# Patient Record
Sex: Female | Born: 1970
Health system: Southern US, Community
[De-identification: ages and names within clinical notes are randomized; demographics above are authoritative.]

## PROBLEM LIST (undated history)

## (undated) DIAGNOSIS — T7840XA Allergy, unspecified, initial encounter: Secondary | ICD-10-CM

## (undated) DIAGNOSIS — E079 Disorder of thyroid, unspecified: Secondary | ICD-10-CM

## (undated) HISTORY — DX: Allergy, unspecified, initial encounter: T78.40XA

## (undated) HISTORY — PX: APPENDECTOMY: SHX54

## (undated) HISTORY — DX: Disorder of thyroid, unspecified: E07.9

## (undated) HISTORY — PX: OTHER SURGICAL HISTORY: SHX169

---

## 2000-03-13 ENCOUNTER — Other Ambulatory Visit: Admission: RE | Admit: 2000-03-13 | Discharge: 2000-03-13 | Payer: Self-pay | Admitting: Gynecology

## 2001-03-19 ENCOUNTER — Other Ambulatory Visit: Admission: RE | Admit: 2001-03-19 | Discharge: 2001-03-19 | Payer: Self-pay | Admitting: Gynecology

## 2002-04-22 ENCOUNTER — Other Ambulatory Visit: Admission: RE | Admit: 2002-04-22 | Discharge: 2002-04-22 | Payer: Self-pay | Admitting: Gynecology

## 2003-05-19 ENCOUNTER — Other Ambulatory Visit: Admission: RE | Admit: 2003-05-19 | Discharge: 2003-05-19 | Payer: Self-pay | Admitting: Gynecology

## 2004-05-19 ENCOUNTER — Other Ambulatory Visit: Admission: RE | Admit: 2004-05-19 | Discharge: 2004-05-19 | Payer: Self-pay | Admitting: Gynecology

## 2005-01-09 ENCOUNTER — Other Ambulatory Visit: Admission: RE | Admit: 2005-01-09 | Discharge: 2005-01-09 | Payer: Self-pay | Admitting: Gynecology

## 2005-02-08 ENCOUNTER — Encounter: Admission: RE | Admit: 2005-02-08 | Discharge: 2005-02-15 | Payer: Self-pay | Admitting: Occupational Medicine

## 2005-07-13 ENCOUNTER — Ambulatory Visit (HOSPITAL_COMMUNITY): Admission: RE | Admit: 2005-07-13 | Discharge: 2005-07-13 | Payer: Self-pay | Admitting: Gynecology

## 2005-08-07 ENCOUNTER — Inpatient Hospital Stay (HOSPITAL_COMMUNITY): Admission: AD | Admit: 2005-08-07 | Discharge: 2005-08-10 | Payer: Self-pay | Admitting: Gynecology

## 2005-09-27 ENCOUNTER — Other Ambulatory Visit: Admission: RE | Admit: 2005-09-27 | Discharge: 2005-09-27 | Payer: Self-pay | Admitting: Gynecology

## 2006-04-17 ENCOUNTER — Encounter: Admission: RE | Admit: 2006-04-17 | Discharge: 2006-04-17 | Payer: Self-pay | Admitting: Gynecology

## 2007-03-14 ENCOUNTER — Other Ambulatory Visit: Admission: RE | Admit: 2007-03-14 | Discharge: 2007-03-14 | Payer: Self-pay | Admitting: Gynecology

## 2008-05-26 ENCOUNTER — Ambulatory Visit: Payer: Self-pay | Admitting: Gynecology

## 2008-05-26 ENCOUNTER — Other Ambulatory Visit: Admission: RE | Admit: 2008-05-26 | Discharge: 2008-05-26 | Payer: Self-pay | Admitting: Gynecology

## 2008-06-25 ENCOUNTER — Ambulatory Visit: Payer: Self-pay | Admitting: Gynecology

## 2008-06-30 ENCOUNTER — Ambulatory Visit: Payer: Self-pay | Admitting: Gynecology

## 2009-02-19 ENCOUNTER — Inpatient Hospital Stay (HOSPITAL_COMMUNITY): Admission: AD | Admit: 2009-02-19 | Discharge: 2009-02-21 | Payer: Self-pay | Admitting: Obstetrics and Gynecology

## 2009-08-10 ENCOUNTER — Emergency Department (HOSPITAL_COMMUNITY): Admission: EM | Admit: 2009-08-10 | Discharge: 2009-08-10 | Payer: Self-pay | Admitting: Family Medicine

## 2009-11-24 ENCOUNTER — Ambulatory Visit: Payer: Self-pay | Admitting: Gynecology

## 2010-03-04 ENCOUNTER — Ambulatory Visit: Payer: Self-pay | Admitting: Gynecology

## 2010-03-30 ENCOUNTER — Ambulatory Visit: Payer: Self-pay | Admitting: Gynecology

## 2010-04-04 ENCOUNTER — Ambulatory Visit: Payer: Self-pay | Admitting: Gynecology

## 2010-09-09 LAB — CBC
HCT: 39.4 % (ref 36.0–46.0)
Hemoglobin: 13.4 g/dL (ref 12.0–15.0)
MCHC: 34.1 g/dL (ref 30.0–36.0)
MCHC: 34.7 g/dL (ref 30.0–36.0)
MCV: 111.4 fL — ABNORMAL HIGH (ref 78.0–100.0)
Platelets: 134 10*3/uL — ABNORMAL LOW (ref 150–400)
Platelets: 137 10*3/uL — ABNORMAL LOW (ref 150–400)
RDW: 13 % (ref 11.5–15.5)
WBC: 11 10*3/uL — ABNORMAL HIGH (ref 4.0–10.5)

## 2010-10-21 NOTE — H&P (Signed)
NAMEMAYBEL, Victoria Ellison                ACCOUNT NO.:  [id number]   MEDICAL RECORD NO.:  [id number]          PATIENT TYPE:  INP   LOCATION:  9163                          FACILITY:  [facility]   PHYSICIAN:  Juan H. Lily Peer, M.D.DATE OF BIRTH:  Aug 16, 1970   DATE OF ADMISSION:  08/07/2005  DATE OF DISCHARGE:                                HISTORY & PHYSICAL   CHIEF COMPLAINT:  Spontaneous rupture of membranes and contractions.   HISTORY:  The patient is a 40 year old, gravida 3, para 2, who is currently  40-2/[redacted] weeks gestation, presented to [facility] complaining of  spontaneous rupture of membranes around 2130 hours on March 5.  On arrival  to [facility] she was 3 cm, 90% effaced and contracting three to four  minutes apart with a reassuring fetal heart rate tracing.  Vital signs were  stable and normotensive.  Upon arrival to labor and delivery she was  rechecked.  She was 4 cm, 90% effaced, -1 station.  Had a spontaneous  deceleration to the 60's for a short period and returned back to to  baseline.  The patient's prenatal course significant for the fact that she  had a normal first trimester screen and normal alpha-fetoprotein test.  She  is GBS negative.  The patient otherwise had an uneventful prenatal course  with the exception of a urinary tract infection and nonspecific palpitations  during the third trimester which she was found to have a physiological  murmur and a normal 12-lead EKG, CBC and thyroid.   ALLERGIES:  She is allergic to SULFA and AMOXICILLIN.   OB HISTORY:  She has had two normal spontaneous vaginal deliveries in 1999  and 2001 respectively at 38 and 41 weeks respectively.   PAST MEDICAL HISTORY:  1.  She had an appendectomy in 1980.  2.  She was diagnosed with hypothyroidism in 1985, currently on no      medications.   PHYSICAL EXAMINATION:  GENERAL:  Well-developed, well-nourished female.  HEENT:  Unremarkable __________ .  NECK:  No carotid bruits or  thyromegaly  LUNGS:  Clear to auscultation without rhonchus or wheezing.  HEART:  Regular rate and rhythm.  No murmurs or gallops.  BREASTS:  Not done.  ABDOMEN:  Gravid uterus.  __________  presentation on Leopold maneuver.  PELVIC:  4 cm, 90%, -1 station.  Gross rupture of membranes of clear.  EXTREMITIES:  DTR 1+. Negative clonus.  Trace edema.   PRENATAL LABS:  O positive blood type, negative antibody screen.  Rubella  with evidence of immunity.  Hepatitis, HIV and VDRL non-reactive.  The  patient with norma first trimester screen and normal alpha-fetoprotein.  No  genetic amniocentesis.  Group B strep was negative.   ASSESSMENT:  A 40 year old, gravida 3, para 2, with spontaneous rupture of  membranes, group B strep negative, 4 cm, 90% effaced, -1 station, reassuring  fetal heart rate tracing, spontaneous contractions every two to three  minutes apart.  The patient requesting epidural.  Will continue to monitor  closely. Anticipate vaginal delivery.   PLAN:  As per assessment above.  Juan H. Lily Peer, M.D.  Electronically Signed     JHF/MEDQ  D:  08/08/2005  T:  08/08/2005  Job:  [id number]

## 2011-04-06 ENCOUNTER — Encounter: Payer: Self-pay | Admitting: *Deleted

## 2011-04-10 ENCOUNTER — Ambulatory Visit (INDEPENDENT_AMBULATORY_CARE_PROVIDER_SITE_OTHER): Payer: 59 | Admitting: Gynecology

## 2011-04-10 ENCOUNTER — Encounter: Payer: Self-pay | Admitting: Gynecology

## 2011-04-10 ENCOUNTER — Other Ambulatory Visit (HOSPITAL_COMMUNITY)
Admission: RE | Admit: 2011-04-10 | Discharge: 2011-04-10 | Disposition: A | Discharge status: Home / Self Care | Payer: 59 | Source: Ambulatory Visit | Attending: Gynecology | Admitting: Gynecology

## 2011-04-10 VITALS — BP 102/62 | Ht 66.5 in | Wt 156.5 lb

## 2011-04-10 DIAGNOSIS — Z01419 Encounter for gynecological examination (general) (routine) without abnormal findings: Secondary | ICD-10-CM | POA: Insufficient documentation

## 2011-04-10 DIAGNOSIS — Z13220 Encounter for screening for lipoid disorders: Secondary | ICD-10-CM

## 2011-04-10 DIAGNOSIS — Z131 Encounter for screening for diabetes mellitus: Secondary | ICD-10-CM

## 2011-04-10 DIAGNOSIS — Z30431 Encounter for routine checking of intrauterine contraceptive device: Secondary | ICD-10-CM

## 2011-04-10 NOTE — Progress Notes (Signed)
Victoria Ellison 1971/06/03 [id number]        40 y.o.  for annual exam.  Doing well does note SUI type symptoms primarily with running or exercising. Has Mirena IUD and has not had a menses with this.  Past medical history,surgical history, medications, allergies, family history and social history were all reviewed and documented in the EPIC chart. ROS:  Was performed and pertinent positives and negatives are included in the history.  Exam: chaperone present Filed Vitals:   04/10/11 1551  BP: 102/62   General appearance  Normal Skin grossly normal Head/Neck normal with no cervical or supraclavicular adenopathy thyroid normal Lungs  clear Cardiac RR, without RMG Abdominal  soft, nontender, without masses, organomegaly or hernia Breasts  examined lying and sitting without masses, retractions, discharge or axillary adenopathy. Pelvic  Ext/BUS/vagina  normal   Cervix  normal  IUD string not visualized, Pap done  Uterus  axial, normal size, shape and contour, midline and mobile nontender   Adnexa  Without masses or tenderness    Anus and perineum  normal   Rectovaginal  normal sphincter tone without palpated masses or tenderness.    Assessment/Plan:  40 y.o. female for annual exam.    1. IUD management. Her IUD string was not visualized. Recommend ultrasound to document placement. She is amenorrheic with the IUD.  I suspect everything is fine but we'll at least check placement once for reassurance and she agrees with this. 2. SUI symptoms. Patient has classic SUI symptoms and I and going to refer to urology to discuss possibility of sling. Patient's unsure if she wants surgery but she can hear what is involved and she agrees with this. 3. Health maintenance. SBE monthly reviewed. Never had mammogram and I recommended baseline mammography. I did a Pap smear today as her last Pap smear documented in our chart was 3 years ago. Assuming this one is normal having no history of abnormals previously,  we'll plan an every 3 year surveillance.  Check baseline CBC glucose lipid profile urinalysis. She'll follow up for her ultrasound and otherwise assuming she continues well she'll follow up in a year sooner as needed.    Dara Lords MD, 4:21 PM 04/10/2011

## 2011-04-11 ENCOUNTER — Other Ambulatory Visit: Payer: Self-pay | Admitting: *Deleted

## 2011-04-11 ENCOUNTER — Telehealth: Payer: Self-pay | Admitting: *Deleted

## 2011-04-11 DIAGNOSIS — N393 Stress incontinence (female) (male): Secondary | ICD-10-CM

## 2011-04-11 NOTE — Telephone Encounter (Signed)
Lm for patient to call.  Appointment set with Dr. Sherron Monday on 05/22/11 @8 :15am.  To arrive at 8am and bring ID, insurance card, and list of meds.

## 2011-04-12 NOTE — Telephone Encounter (Signed)
Patient informed. 

## 2011-04-12 NOTE — Telephone Encounter (Signed)
Will send note to Dr. Mina Marble office after patient's ultrasound in the office.

## 2011-04-25 ENCOUNTER — Ambulatory Visit (INDEPENDENT_AMBULATORY_CARE_PROVIDER_SITE_OTHER): Payer: 59

## 2011-04-25 ENCOUNTER — Ambulatory Visit (INDEPENDENT_AMBULATORY_CARE_PROVIDER_SITE_OTHER): Payer: 59 | Admitting: Gynecology

## 2011-04-25 ENCOUNTER — Encounter: Payer: Self-pay | Admitting: Gynecology

## 2011-04-25 DIAGNOSIS — Z30431 Encounter for routine checking of intrauterine contraceptive device: Secondary | ICD-10-CM

## 2011-04-25 DIAGNOSIS — N8300 Follicular cyst of ovary, unspecified side: Secondary | ICD-10-CM

## 2011-04-25 DIAGNOSIS — N898 Other specified noninflammatory disorders of vagina: Secondary | ICD-10-CM

## 2011-04-25 DIAGNOSIS — T8339XA Other mechanical complication of intrauterine contraceptive device, initial encounter: Secondary | ICD-10-CM

## 2011-04-25 DIAGNOSIS — B3731 Acute candidiasis of vulva and vagina: Secondary | ICD-10-CM

## 2011-04-25 DIAGNOSIS — B373 Candidiasis of vulva and vagina: Secondary | ICD-10-CM

## 2011-04-25 MED ORDER — FLUCONAZOLE 200 MG PO TABS: 200.0000 mg | ORAL_TABLET | Freq: Every day | ORAL | Status: AC

## 2011-04-25 NOTE — Progress Notes (Signed)
Patient presents for ultrasound IUD check. Her Pap smear from her visit returned showing endometrial cells and yeast. Patient did treat herself with OTC antifungal is at the did not seem to totally clear her symptoms of itching.  Ultrasound shows endometrial echo 2.7 mm right and left ovaries are normal IUD is within the endometrial cavity.  Assessment and plan: 1. Yeast vaginitis. We'll treat with Diflucan 200 mg daily x3 days. 2. IUD placement. Ultrasound confirms intrauterine placement. 3. Endometrial cells on Pap smear. Given her clinical picture with IUD and send an endometrial echo of 2.7 mm I think the endometrial cells are of clinical insignificance.  Routine follow up with Pap smear next year recommended. Patient agrees with this.

## 2011-05-02 ENCOUNTER — Telehealth: Payer: Self-pay | Admitting: *Deleted

## 2011-05-02 NOTE — Telephone Encounter (Signed)
Message copied by Valeda Malm L on Tue May 02, 2011 11:19 AM ------      Message from: Colin Broach P      Created: Mon Apr 10, 2011  4:26 PM       Schedule an appointment with urology in reference to stress incontinence symptoms for consideration of sling

## 2011-05-02 NOTE — Telephone Encounter (Signed)
Patient was informed appointment set with Dr. Sherron Monday on 05/22/11 @ 8am.  Records faxed.

## 2011-06-20 ENCOUNTER — Ambulatory Visit: Payer: 59 | Attending: Urology | Admitting: Physical Therapy

## 2011-06-20 DIAGNOSIS — N393 Stress incontinence (female) (male): Secondary | ICD-10-CM | POA: Insufficient documentation

## 2011-06-20 DIAGNOSIS — R29898 Other symptoms and signs involving the musculoskeletal system: Secondary | ICD-10-CM | POA: Insufficient documentation

## 2011-06-20 DIAGNOSIS — IMO0001 Reserved for inherently not codable concepts without codable children: Secondary | ICD-10-CM | POA: Insufficient documentation

## 2012-02-28 ENCOUNTER — Other Ambulatory Visit: Payer: Self-pay | Admitting: Gynecology

## 2012-02-28 DIAGNOSIS — Z1231 Encounter for screening mammogram for malignant neoplasm of breast: Secondary | ICD-10-CM

## 2012-03-21 ENCOUNTER — Ambulatory Visit (HOSPITAL_COMMUNITY)
Admission: RE | Admit: 2012-03-21 | Discharge: 2012-03-21 | Disposition: A | Discharge status: Home / Self Care | Payer: 59 | Source: Ambulatory Visit | Attending: Gynecology | Admitting: Gynecology

## 2012-03-21 DIAGNOSIS — Z1231 Encounter for screening mammogram for malignant neoplasm of breast: Secondary | ICD-10-CM | POA: Insufficient documentation

## 2012-04-11 ENCOUNTER — Ambulatory Visit (INDEPENDENT_AMBULATORY_CARE_PROVIDER_SITE_OTHER): Payer: 59 | Admitting: Gynecology

## 2012-04-11 ENCOUNTER — Encounter: Payer: Self-pay | Admitting: Gynecology

## 2012-04-11 VITALS — BP 116/74 | Ht 66.5 in | Wt 158.0 lb

## 2012-04-11 DIAGNOSIS — Z01419 Encounter for gynecological examination (general) (routine) without abnormal findings: Secondary | ICD-10-CM

## 2012-04-11 DIAGNOSIS — B373 Candidiasis of vulva and vagina: Secondary | ICD-10-CM

## 2012-04-11 DIAGNOSIS — B3731 Acute candidiasis of vulva and vagina: Secondary | ICD-10-CM

## 2012-04-11 DIAGNOSIS — Z30431 Encounter for routine checking of intrauterine contraceptive device: Secondary | ICD-10-CM

## 2012-04-11 DIAGNOSIS — N898 Other specified noninflammatory disorders of vagina: Secondary | ICD-10-CM

## 2012-04-11 LAB — WET PREP FOR TRICH, YEAST, CLUE
Clue Cells Wet Prep HPF POC: NONE SEEN
Trich, Wet Prep: NONE SEEN

## 2012-04-11 MED ORDER — FLUCONAZOLE 150 MG PO TABS: 150.0000 mg | ORAL_TABLET | ORAL | Status: DC

## 2012-04-11 MED ORDER — FLUCONAZOLE 200 MG PO TABS: 200.0000 mg | ORAL_TABLET | Freq: Every day | ORAL | Status: DC

## 2012-04-11 NOTE — Addendum Note (Signed)
Addended by: Dayna Barker on: 04/11/2012 04:58 PM   Modules accepted: Orders

## 2012-04-11 NOTE — Progress Notes (Signed)
Victoria Ellison 06-16-1970 [id number]        41 y.o.  [id number] for annual exam.    Past medical history,surgical history, medications, allergies, family history and social history were all reviewed and documented in the EPIC chart. ROS:  Was performed and pertinent positives and negatives are included in the history.  Exam: Kim assistant Filed Vitals:   04/11/12 1603  BP: 116/74  Height: 5' 6.5" (1.689 m)  Weight: 158 lb (71.668 kg)   General appearance  Normal Skin grossly normal Head/Neck normal with no cervical or supraclavicular adenopathy thyroid normal Lungs  clear Cardiac RR, without RMG Abdominal  soft, nontender, without masses, organomegaly or hernia Breasts  examined lying and sitting without masses, retractions, discharge or axillary adenopathy. Pelvic  Ext/BUS/vagina  normal with white discharge  Cervix  normal IUD string not visualized  Uterus  anteverted, normal size, shape and contour, midline and mobile nontender   Adnexa  Without masses or tenderness    Anus and perineum  normal   Rectovaginal  normal sphincter tone without palpated masses or tenderness.    Assessment/Plan:  41 y.o. [id number] female for annual exam.   1. White discharge/itching. Wet prep is positive for yeast. Seems to have frequent recurrences throughout the year. Glucose check last year was normal. Options for management include intermittent treatment versus suppressive therapy discussed. She wants to try a prophylactic suppression. We'll treat with Diflucan 200 daily x5 days and then Diflucan 150 weekly x4 months. Follow up if any recurrences. 2. Mirena IUD 02/2010. Doing well, amenorrheic. String not visualized as in the past with ultrasound documenting intrauterine placement. We'll continue to monitor. 3. Mammography 03/2012. Continue with annual mammography. SBE monthly reviewed. 4. Pap smear. No Pap smear done today. Pap smear 04/2011 normal. It did show benign appearing endometrial cells, she had  her IUD in place an ultrasound for IUD placement showed an endometrial echo of 2.7 mm. No history of abnormal Pap smears previously.  We'll plan every 3 year to 5 years screening. 5. Health maintenance. We'll check urinalysis. Otherwise no blood work done as she had a normal CBC glucose and lipid profile last year. Follow up one year, sooner as needed.    Dara Lords MD, 4:40 PM 04/11/2012

## 2012-04-11 NOTE — Patient Instructions (Signed)
Take Diflucan 200 mg daily for 5 days then Diflucan 150 mg weekly x4 months. Call if there are any issues. Otherwise follow up in one year for annual gynecologic exam.

## 2012-04-12 LAB — URINALYSIS W MICROSCOPIC + REFLEX CULTURE
Hgb urine dipstick: NEGATIVE
Leukocytes, UA: NEGATIVE
Nitrite: NEGATIVE
Protein, ur: NEGATIVE mg/dL

## 2012-09-05 ENCOUNTER — Encounter: Payer: Self-pay | Admitting: Gynecology

## 2012-09-05 ENCOUNTER — Ambulatory Visit (INDEPENDENT_AMBULATORY_CARE_PROVIDER_SITE_OTHER): Payer: 59 | Admitting: Gynecology

## 2012-09-05 DIAGNOSIS — R300 Dysuria: Secondary | ICD-10-CM

## 2012-09-05 DIAGNOSIS — N390 Urinary tract infection, site not specified: Secondary | ICD-10-CM

## 2012-09-05 LAB — URINALYSIS W MICROSCOPIC + REFLEX CULTURE
Nitrite: NEGATIVE
Protein, ur: NEGATIVE mg/dL
Urobilinogen, UA: 0.2 mg/dL (ref 0.0–1.0)

## 2012-09-05 MED ORDER — NITROFURANTOIN MONOHYD MACRO 100 MG PO CAPS: 100.0000 mg | ORAL_CAPSULE | Freq: Two times a day (BID) | ORAL | Status: DC

## 2012-09-05 NOTE — Patient Instructions (Signed)
Take Macrodantin antibiotic twice daily for 7 days. Repeat urinalysis in several weeks just to make sure the blood in the urine clears. Followup sooner if symptoms persist, worsen or recur.

## 2012-09-05 NOTE — Progress Notes (Signed)
Patient presents complaining for the last several days just not feeling well over all. Over the last 24 hours onset of dysuria and mild frequency. No low back pain fever chills nausea vomiting or other constitutional symptoms.  Exam Spine straight no CVA tenderness. Abdomen soft nontender without masses guarding rebound organomegaly.  Assessment and plan: Urinalysis and symptoms consistent with UTI. Will treat with Macrobid 100 mg twice a day x7 days. I did ask her to repeat a clean-catch urine in several weeks just to make sure the hematuria clears which I think is due to her infection. Followup sooner if symptoms persist, worsen or recur.

## 2012-09-07 LAB — URINE CULTURE: Colony Count: >=100000

## 2012-09-13 ENCOUNTER — Other Ambulatory Visit: Payer: Self-pay | Admitting: Gynecology

## 2012-09-13 DIAGNOSIS — N390 Urinary tract infection, site not specified: Secondary | ICD-10-CM

## 2012-10-04 ENCOUNTER — Other Ambulatory Visit: Payer: 59

## 2012-10-04 DIAGNOSIS — N390 Urinary tract infection, site not specified: Secondary | ICD-10-CM

## 2012-10-06 LAB — URINE CULTURE

## 2013-04-08 ENCOUNTER — Ambulatory Visit (INDEPENDENT_AMBULATORY_CARE_PROVIDER_SITE_OTHER): Payer: 59 | Admitting: General Practice

## 2013-04-08 ENCOUNTER — Encounter: Payer: Self-pay | Admitting: General Practice

## 2013-04-08 ENCOUNTER — Encounter (INDEPENDENT_AMBULATORY_CARE_PROVIDER_SITE_OTHER): Payer: Self-pay

## 2013-04-08 VITALS — BP 108/69 | HR 61 | Temp 97.1°F | Ht 66.5 in | Wt 157.0 lb

## 2013-04-08 DIAGNOSIS — Z01419 Encounter for gynecological examination (general) (routine) without abnormal findings: Secondary | ICD-10-CM

## 2013-04-08 DIAGNOSIS — N390 Urinary tract infection, site not specified: Secondary | ICD-10-CM

## 2013-04-08 LAB — POCT URINALYSIS DIPSTICK
Blood, UA: NEGATIVE
Glucose, UA: NEGATIVE
Ketones, UA: NEGATIVE
Protein, UA: NEGATIVE
Spec Grav, UA: <=1.005
Urobilinogen, UA: NEGATIVE

## 2013-04-08 LAB — POCT UA - MICROSCOPIC ONLY
Casts, Ur, LPF, POC: NEGATIVE
Crystals, Ur, HPF, POC: NEGATIVE

## 2013-04-08 MED ORDER — CIPROFLOXACIN HCL 500 MG PO TABS: 500.0000 mg | ORAL_TABLET | Freq: Two times a day (BID) | ORAL | Status: DC

## 2013-04-08 NOTE — Progress Notes (Signed)
  Subjective:    Patient ID: Victoria Ellison, female    DOB: 1970-06-29, 42 y.o.   MRN: [id number]  Urinary Tract Infection  This is a new problem. The current episode started today. The problem occurs every urination. The problem has been gradually worsening. The quality of the pain is described as aching and burning. There has been no fever. She is sexually active. There is no history of pyelonephritis. Associated symptoms include flank pain, frequency and urgency. Pertinent negatives include no chills, hematuria, nausea, possible pregnancy or vomiting. She has tried increased fluids for the symptoms. Her past medical history is significant for recurrent UTIs.      Review of Systems  Constitutional: Negative for fever and chills.  Respiratory: Negative for chest tightness and shortness of breath.   Cardiovascular: Negative for chest pain and palpitations.  Gastrointestinal: Negative for nausea and vomiting.  Genitourinary: Positive for dysuria, urgency, frequency and flank pain. Negative for hematuria.  Neurological: Negative for dizziness, weakness and headaches.       Objective:   Physical Exam  Constitutional: She is oriented to person, place, and time. She appears well-developed and well-nourished.  Cardiovascular: Normal rate, regular rhythm and normal heart sounds.   Pulmonary/Chest: Effort normal and breath sounds normal.  Abdominal: Soft. Bowel sounds are normal. She exhibits no distension. There is tenderness in the suprapubic area. There is no CVA tenderness.  Neurological: She is alert and oriented to person, place, and time.  Skin: Skin is warm and dry.  Psychiatric: She has a normal mood and affect.          Assessment & Plan:  1. Encounter for routine gynecological examination  - POCT UA - Microscopic Only - POCT urinalysis dipstick  2. UTI (urinary tract infection)  - ciprofloxacin (CIPRO) 500 MG tablet; Take 1 tablet (500 mg total) by mouth 2 (two) times daily.   Dispense: 14 tablet; Refill: 0 -Increase fluid intake AZO over the counter X2 days Frequent voiding Proper perineal hygiene RTO prn Patient verbalized understanding Coralie Keens, FNP-C

## 2013-04-08 NOTE — Patient Instructions (Signed)
Urinary Tract Infection  Urinary tract infections (UTIs) can develop anywhere along your urinary tract. Your urinary tract is your body's drainage system for removing wastes and extra water. Your urinary tract includes two kidneys, two ureters, a bladder, and a urethra. Your kidneys are a pair of bean-shaped organs. Each kidney is about the size of your fist. They are located below your ribs, one on each side of your spine.  CAUSES  Infections are caused by microbes, which are microscopic organisms, including fungi, viruses, and bacteria. These organisms are so small that they can only be seen through a microscope. Bacteria are the microbes that most commonly cause UTIs.  SYMPTOMS   Symptoms of UTIs may vary by age and gender of the patient and by the location of the infection. Symptoms in young women typically include a frequent and intense urge to urinate and a painful, burning feeling in the bladder or urethra during urination. Older women and men are more likely to be tired, shaky, and weak and have muscle aches and abdominal pain. A fever may mean the infection is in your kidneys. Other symptoms of a kidney infection include pain in your back or sides below the ribs, nausea, and vomiting.  DIAGNOSIS  To diagnose a UTI, your caregiver will ask you about your symptoms. Your caregiver also will ask to provide a urine sample. The urine sample will be tested for bacteria and white blood cells. White blood cells are made by your body to help fight infection.  TREATMENT   Typically, UTIs can be treated with medication. Because most UTIs are caused by a bacterial infection, they usually can be treated with the use of antibiotics. The choice of antibiotic and length of treatment depend on your symptoms and the type of bacteria causing your infection.  HOME CARE INSTRUCTIONS   If you were prescribed antibiotics, take them exactly as your caregiver instructs you. Finish the medication even if you feel better after you  have only taken some of the medication.   Drink enough water and fluids to keep your urine clear or pale yellow.   Avoid caffeine, tea, and carbonated beverages. They tend to irritate your bladder.   Empty your bladder often. Avoid holding urine for long periods of time.   Empty your bladder before and after sexual intercourse.   After a bowel movement, women should cleanse from front to back. Use each tissue only once.  SEEK MEDICAL CARE IF:    You have back pain.   You develop a fever.   Your symptoms do not begin to resolve within 3 days.  SEEK IMMEDIATE MEDICAL CARE IF:    You have severe back pain or lower abdominal pain.   You develop chills.   You have nausea or vomiting.   You have continued burning or discomfort with urination.  MAKE SURE YOU:    Understand these instructions.   Will watch your condition.   Will get help right away if you are not doing well or get worse.  Document Released: 03/01/2005 Document Revised: 11/21/2011 Document Reviewed: 06/30/2011  ExitCare Patient Information 2014 [employer].

## 2013-04-09 ENCOUNTER — Other Ambulatory Visit: Payer: Self-pay | Admitting: General Practice

## 2013-04-09 ENCOUNTER — Other Ambulatory Visit (HOSPITAL_COMMUNITY): Payer: Self-pay | Admitting: Family Medicine

## 2013-04-09 DIAGNOSIS — Z1231 Encounter for screening mammogram for malignant neoplasm of breast: Secondary | ICD-10-CM

## 2013-04-25 ENCOUNTER — Ambulatory Visit (HOSPITAL_COMMUNITY)
Admission: RE | Admit: 2013-04-25 | Discharge: 2013-04-25 | Disposition: A | Discharge status: Home / Self Care | Payer: 59 | Source: Ambulatory Visit | Attending: Family Medicine | Admitting: Family Medicine

## 2013-04-25 DIAGNOSIS — Z1231 Encounter for screening mammogram for malignant neoplasm of breast: Secondary | ICD-10-CM | POA: Insufficient documentation

## 2014-03-06 ENCOUNTER — Other Ambulatory Visit: Payer: Self-pay | Admitting: Family Medicine

## 2014-03-06 DIAGNOSIS — N63 Unspecified lump in unspecified breast: Secondary | ICD-10-CM

## 2014-03-13 ENCOUNTER — Ambulatory Visit
Admission: RE | Admit: 2014-03-13 | Discharge: 2014-03-13 | Disposition: A | Discharge status: Home / Self Care | Payer: 59 | Source: Ambulatory Visit | Attending: Family Medicine | Admitting: Family Medicine

## 2014-03-13 ENCOUNTER — Encounter (INDEPENDENT_AMBULATORY_CARE_PROVIDER_SITE_OTHER): Payer: Self-pay

## 2014-03-13 DIAGNOSIS — N63 Unspecified lump in unspecified breast: Secondary | ICD-10-CM

## 2014-04-06 ENCOUNTER — Encounter: Payer: Self-pay | Admitting: General Practice

## 2015-02-16 ENCOUNTER — Other Ambulatory Visit: Payer: Self-pay

## 2015-02-16 ENCOUNTER — Other Ambulatory Visit: Payer: Self-pay | Admitting: Family Medicine

## 2015-02-16 DIAGNOSIS — Z1231 Encounter for screening mammogram for malignant neoplasm of breast: Secondary | ICD-10-CM

## 2015-03-03 ENCOUNTER — Ambulatory Visit
Admission: RE | Admit: 2015-03-03 | Discharge: 2015-03-03 | Disposition: A | Discharge status: Home / Self Care | Payer: 59 | Source: Ambulatory Visit | Attending: Family Medicine | Admitting: Family Medicine

## 2015-03-03 DIAGNOSIS — Z1231 Encounter for screening mammogram for malignant neoplasm of breast: Secondary | ICD-10-CM

## 2015-07-08 DIAGNOSIS — J309 Allergic rhinitis, unspecified: Secondary | ICD-10-CM | POA: Diagnosis not present

## 2015-07-19 DIAGNOSIS — E039 Hypothyroidism, unspecified: Secondary | ICD-10-CM | POA: Diagnosis not present

## 2015-07-19 DIAGNOSIS — R946 Abnormal results of thyroid function studies: Secondary | ICD-10-CM | POA: Diagnosis not present

## 2015-07-21 DIAGNOSIS — R05 Cough: Secondary | ICD-10-CM | POA: Diagnosis not present

## 2015-07-21 DIAGNOSIS — E039 Hypothyroidism, unspecified: Secondary | ICD-10-CM | POA: Diagnosis not present

## 2015-07-21 DIAGNOSIS — J309 Allergic rhinitis, unspecified: Secondary | ICD-10-CM | POA: Diagnosis not present

## 2015-07-21 MED FILL — AZELASTINE HCL 137 MCG SPRY: 0.1 | 50 days supply | Qty: 30 | Fill #0

## 2015-07-21 MED FILL — LEVOTHYROXINE 75 MCG TABLET: 75 | 90 days supply | Qty: 90 | Fill #0

## 2015-08-04 ENCOUNTER — Encounter: Payer: Self-pay | Admitting: Gynecology

## 2015-08-04 ENCOUNTER — Ambulatory Visit (INDEPENDENT_AMBULATORY_CARE_PROVIDER_SITE_OTHER): Payer: 59 | Admitting: Gynecology

## 2015-08-04 VITALS — BP 110/66 | Ht 66.5 in | Wt 160.0 lb

## 2015-08-04 DIAGNOSIS — Z30431 Encounter for routine checking of intrauterine contraceptive device: Secondary | ICD-10-CM

## 2015-08-04 NOTE — Patient Instructions (Signed)
Follow up for Mirena IUD replacement as scheduled

## 2015-08-04 NOTE — Progress Notes (Signed)
[name] 02-May-1971 [id number]        45 y.o.  [id number] Presents for discussion about replacing her IUD. She has a Mirena IUD placed 02/2010. Obtains her routine GYN health care through her primary physician with reported recent full exam and Pap smear 04/2015.  Also had mammogram 02/2015. Is here only to address her Mirena IUD issue.  Past medical history,surgical history, problem list, medications, allergies, family history and social history were all reviewed and documented in the EPIC chart.  Directed ROS with pertinent positives and negatives documented in the history of present illness/assessment and plan.  Exam: Victoria Ellison assistant Filed Vitals:   08/04/15 1546  BP: 110/66  Height: 5' 6.5" (1.689 m)  Weight: 160 lb (72.576 kg)   General appearance:  Normal Abdomen soft nontender without masses guarding rebound Pelvic external BUS vagina normal. Cervix normal. IUD string not visualized. Uterus anteverted normal size midline mobile nontender. Adnexa without masses or tenderness  Assessment/Plan:  45 y.o. [id number] with Mirena IUD due to be replaced 02/2015. Discussed with patient she is a little overdue. Reviewed with patient most recent literature suggests IUD good for up to 7 years. Not current Mirena recommendations. Alternative contraceptive options also reviewed. She is amenorrheic from the [place] and enjoys this benefit. After lengthy discussion she wants to go ahead and have her Mirena IUD replaced and accepts the issue possibilities of pregnancy being that she is past due. I reviewed the removal and reinsertion process with her. Risks to  include infection, either immediate or long-term, uterine perforation or migration requiring surgery to remove, other complications such as pain, hormonal side effects and possibility of failure with subsequent pregnancy were all discussed. Patient will schedule appointment in follow up for replacement.     Victoria Lords MD, 4:28 PM  08/04/2015

## 2015-08-05 ENCOUNTER — Telehealth: Payer: Self-pay | Admitting: Gynecology

## 2015-08-05 NOTE — Telephone Encounter (Signed)
08/05/15-I LM VM for pt that her [facility] [employer] ins will cover the Mirena for contraception under her $40 copay. Appt already made with TF/wl Per [email or web address] [phone]

## 2015-08-09 ENCOUNTER — Encounter: Payer: Self-pay | Admitting: Gynecology

## 2015-08-09 ENCOUNTER — Ambulatory Visit (INDEPENDENT_AMBULATORY_CARE_PROVIDER_SITE_OTHER): Payer: 59 | Admitting: Gynecology

## 2015-08-09 VITALS — BP 110/60

## 2015-08-09 DIAGNOSIS — Z30433 Encounter for removal and reinsertion of intrauterine contraceptive device: Secondary | ICD-10-CM | POA: Diagnosis not present

## 2015-08-09 HISTORY — PX: INTRAUTERINE DEVICE INSERTION: SHX323

## 2015-08-09 NOTE — Progress Notes (Signed)
Patient presents for Mirena IUD removal and replacement. She has read through the booklet, has no contraindications and signed the consent form.  I again reviewed the removal and insertional process with her and we have discussed the risks to include infection, either immediate or long-term, uterine perforation or migration requiring surgery to remove, other complications such as pain, hormonal side effects and possibility of failure with subsequent pregnancy.   Exam with Kennon PortelaKim Gardner assistant Pelvic: External BUS vagina normal. Cervix normal with IUD string not visualized. Uterus axial to anteverted normal size shape contour midline mobile nontender. Adnexa without masses or tenderness.  Procedure: The cervix visualized with the speculum and opening and closing [facility] within the endocervical canal the IUD string was ultimately grasped, her older Mirena removed without difficulty, shown to the patient and discarded.  The cervix was cleansed with Betadine, anterior lip grasped with a single-tooth tenaculum, the uterus was sounded and a Mirena IUD was placed according to manufacturer's recommendations without difficulty. The strings were trimmed. The patient tolerated well and will follow up in one month for a postinsertional check.  Lot number:  [id number]    Dara LordsFONTAINE,Kalmen Lollar P MD, 3:56 PM 08/09/2015

## 2015-08-09 NOTE — Patient Instructions (Signed)
Intrauterine Device Insertion Most often, an intrauterine device (IUD) is inserted into the uterus to prevent pregnancy. There are 2 types of IUDs available:  Copper IUD--This type of IUD creates an environment that is not favorable to sperm survival. The mechanism of action of the copper IUD is not known for certain. It can stay in place for 10 years.  Hormone IUD--This type of IUD contains the hormone progestin (synthetic progesterone). The progestin thickens the cervical mucus and prevents sperm from entering the uterus, and it also thins the uterine lining. There is no evidence that the hormone IUD prevents implantation. One hormone IUD can stay in place for up to 5 years, and a different hormone IUD can stay in place for up to 3 years. An IUD is the most cost-effective birth control if left in place for the full duration. It may be removed at any time. LET YOUR HEALTH CARE PROVIDER KNOW ABOUT:  Any allergies you have.  All medicines you are taking, including vitamins, herbs, eye drops, creams, and over-the-counter medicines.  Previous problems you or members of your family have had with the use of anesthetics.  Any blood disorders you have.  Previous surgeries you have had.  Possibility of pregnancy.  Medical conditions you have. RISKS AND COMPLICATIONS  Generally, intrauterine device insertion is a safe procedure. However, as with any procedure, complications can occur. Possible complications include:  Accidental puncture (perforation) of the uterus.  Accidental placement of the IUD either in the muscle layer of the uterus (myometrium) or outside the uterus. If this happens, the IUD can be found essentially floating around the bowels and must be taken out surgically.  The IUD may fall out of the uterus (expulsion). This is more common in women who have recently had a child.   Pregnancy in the fallopian tube (ectopic).  Pelvic inflammatory disease (PID), which is infection of  the uterus and fallopian tubes. The risk of PID is slightly increased in the first 20 days after the IUD is placed, but the overall risk is still very low. BEFORE THE PROCEDURE  Schedule the IUD insertion for when you will have your menstrual period or right after, to make sure you are not pregnant. Placement of the IUD is better tolerated shortly after a menstrual cycle.  You may need to take tests or be examined to make sure you are not pregnant.  You may be required to take a pregnancy test.  You may be required to get checked for sexually transmitted infections (STIs) prior to placement. Placing an IUD in someone who has an infection can make the infection worse.  You may be given a pain reliever to take 1 or 2 hours before the procedure.  An exam will be performed to determine the size and position of your uterus.  Ask your health care provider about changing or stopping your regular medicines. PROCEDURE   A tool (speculum) is placed in the vagina. This allows your health care provider to see the lower part of the uterus (cervix).  The cervix is prepped with a medicine that lowers the risk of infection.  You may be given a medicine to numb each side of the cervix (intracervical or paracervical block). This is used to block and control any discomfort with insertion.  A tool (uterine sound) is inserted into the uterus to determine the length of the uterine cavity and the direction the uterus may be tilted.  A slim instrument (IUD inserter) is inserted through the cervical   canal and into your uterus.  The IUD is placed in the uterine cavity and the insertion device is removed.  The nylon string that is attached to the IUD and used for eventual IUD removal is trimmed. It is trimmed so that it lays high in the vagina, just outside the cervix. AFTER THE PROCEDURE  You may have bleeding after the procedure. This is normal. It varies from light spotting for a few days to menstrual-like  bleeding.  You may have mild cramping.   This information is not intended to replace advice given to you by your health care provider. Make sure you discuss any questions you have with your health care provider.   Document Released: 01/18/2011 Document Revised: 03/12/2013 Document Reviewed: 11/10/2012 Elsevier Interactive Patient Education 2016 [employer] Inc.  

## 2015-08-10 ENCOUNTER — Encounter: Payer: Self-pay | Admitting: Gynecology

## 2015-09-06 DIAGNOSIS — E039 Hypothyroidism, unspecified: Secondary | ICD-10-CM | POA: Diagnosis not present

## 2015-09-06 DIAGNOSIS — R946 Abnormal results of thyroid function studies: Secondary | ICD-10-CM | POA: Diagnosis not present

## 2015-09-08 DIAGNOSIS — E039 Hypothyroidism, unspecified: Secondary | ICD-10-CM | POA: Diagnosis not present

## 2015-09-08 MED FILL — LEVOTHYROXINE 100 MCG TAB: 100 | 90 days supply | Qty: 90 | Fill #0

## 2015-09-09 ENCOUNTER — Encounter: Payer: Self-pay | Admitting: Gynecology

## 2015-09-09 ENCOUNTER — Ambulatory Visit (INDEPENDENT_AMBULATORY_CARE_PROVIDER_SITE_OTHER): Payer: 59 | Admitting: Gynecology

## 2015-09-09 VITALS — BP 114/66

## 2015-09-09 DIAGNOSIS — Z30431 Encounter for routine checking of intrauterine contraceptive device: Secondary | ICD-10-CM | POA: Diagnosis not present

## 2015-09-09 NOTE — Patient Instructions (Addendum)
Follow up in you're due for your annual exam.  Follow up if you continue to have any issues with your IUD.

## 2015-09-09 NOTE — Progress Notes (Signed)
    Victoria PaymentCynthia R Ellison [birth year] [id number]        45 y.o.  [id number] Presents for post-IUD removal/insertion checkup. Had her Mirena IUD replaced 08/09/2015. Doing well with some spotting.  Past medical history,surgical history, problem list, medications, allergies, family history and social history were all reviewed and documented in the EPIC chart.  Directed ROS with pertinent positives and negatives documented in the history of present illness/assessment and plan.  Exam: Kennon PortelaKim Ellison assistant Filed Vitals:   09/09/15 1552  BP: 114/66   General appearance:  Normal Abdomen soft nontender without masses guarding rebound Pelvic external BUS vagina normal. Cervix normal with IUD string visualized and appropriate length. Uterus normal size midline mobile nontender. Adnexa without masses or tenderness.  Assessment/Plan:  45 y.o. [id number] with normal IUD check. Will follow up for her annual exam with her primary physician when do otherwise if any issues with the IUD she'll represent for evaluation.    Dara LordsFONTAINE,TIMOTHY P MD, 4:01 PM 09/09/2015

## 2015-12-15 DIAGNOSIS — E039 Hypothyroidism, unspecified: Secondary | ICD-10-CM | POA: Diagnosis not present

## 2015-12-15 DIAGNOSIS — R946 Abnormal results of thyroid function studies: Secondary | ICD-10-CM | POA: Diagnosis not present

## 2015-12-17 DIAGNOSIS — E039 Hypothyroidism, unspecified: Secondary | ICD-10-CM | POA: Diagnosis not present

## 2015-12-17 MED FILL — LEVOTHYROXINE 88 MCG TABLET: 88 | 90 days supply | Qty: 90 | Fill #0

## 2016-03-13 MED FILL — LEVOTHYROXINE 88 MCG TABLET: 88 | 90 days supply | Qty: 90 | Fill #1

## 2016-03-14 DIAGNOSIS — Z0000 Encounter for general adult medical examination without abnormal findings: Secondary | ICD-10-CM | POA: Diagnosis not present

## 2016-03-23 DIAGNOSIS — Z136 Encounter for screening for cardiovascular disorders: Secondary | ICD-10-CM | POA: Diagnosis not present

## 2016-03-23 DIAGNOSIS — N390 Urinary tract infection, site not specified: Secondary | ICD-10-CM | POA: Diagnosis not present

## 2016-03-23 DIAGNOSIS — Z0000 Encounter for general adult medical examination without abnormal findings: Secondary | ICD-10-CM | POA: Diagnosis not present

## 2016-03-23 DIAGNOSIS — Z6825 Body mass index (BMI) 25.0-25.9, adult: Secondary | ICD-10-CM | POA: Diagnosis not present

## 2016-03-23 MED FILL — FLUCONAZOLE 150 MG TABLET: 150 | 8 days supply | Qty: 2 | Fill #0

## 2016-04-03 ENCOUNTER — Other Ambulatory Visit: Payer: Self-pay | Admitting: Family Medicine

## 2016-04-03 DIAGNOSIS — Z1231 Encounter for screening mammogram for malignant neoplasm of breast: Secondary | ICD-10-CM

## 2016-04-21 ENCOUNTER — Ambulatory Visit
Admission: RE | Admit: 2016-04-21 | Discharge: 2016-04-21 | Disposition: A | Discharge status: Home / Self Care | Payer: 59 | Source: Ambulatory Visit | Attending: Family Medicine | Admitting: Family Medicine

## 2016-04-21 DIAGNOSIS — Z1231 Encounter for screening mammogram for malignant neoplasm of breast: Secondary | ICD-10-CM

## 2016-05-30 MED FILL — FLUCONAZOLE 150 MG TABLET: 150 | 8 days supply | Qty: 2 | Fill #1

## 2016-06-02 MED FILL — LEVOTHYROXINE 88 MCG TABLET: 88 | 90 days supply | Qty: 90 | Fill #2

## 2016-08-01 DIAGNOSIS — R946 Abnormal results of thyroid function studies: Secondary | ICD-10-CM | POA: Diagnosis not present

## 2016-08-03 DIAGNOSIS — Z6825 Body mass index (BMI) 25.0-25.9, adult: Secondary | ICD-10-CM | POA: Diagnosis not present

## 2016-08-03 DIAGNOSIS — R32 Unspecified urinary incontinence: Secondary | ICD-10-CM | POA: Diagnosis not present

## 2016-08-03 DIAGNOSIS — E039 Hypothyroidism, unspecified: Secondary | ICD-10-CM | POA: Diagnosis not present

## 2016-08-03 DIAGNOSIS — N760 Acute vaginitis: Secondary | ICD-10-CM | POA: Diagnosis not present

## 2016-08-03 DIAGNOSIS — N952 Postmenopausal atrophic vaginitis: Secondary | ICD-10-CM | POA: Diagnosis not present

## 2016-09-11 MED FILL — LEVOTHYROXINE 88 MCG TABLET: 88 | 90 days supply | Qty: 90 | Fill #0

## 2016-12-05 MED FILL — LEVOTHYROXINE 88 MCG TABLET: 88 | 90 days supply | Qty: 90 | Fill #1

## 2017-03-08 MED FILL — LEVOTHYROXINE 88 MCG TABLET: 88 | 90 days supply | Qty: 90 | Fill #2

## 2017-03-20 DIAGNOSIS — Z0000 Encounter for general adult medical examination without abnormal findings: Secondary | ICD-10-CM | POA: Diagnosis not present

## 2017-03-22 ENCOUNTER — Other Ambulatory Visit: Payer: Self-pay | Admitting: Family Medicine

## 2017-03-22 DIAGNOSIS — Z6825 Body mass index (BMI) 25.0-25.9, adult: Secondary | ICD-10-CM | POA: Diagnosis not present

## 2017-03-22 DIAGNOSIS — Z1231 Encounter for screening mammogram for malignant neoplasm of breast: Secondary | ICD-10-CM

## 2017-03-22 DIAGNOSIS — Z0000 Encounter for general adult medical examination without abnormal findings: Secondary | ICD-10-CM | POA: Diagnosis not present

## 2017-03-22 DIAGNOSIS — Z01419 Encounter for gynecological examination (general) (routine) without abnormal findings: Secondary | ICD-10-CM | POA: Diagnosis not present

## 2017-03-22 MED FILL — LEVOTHYROXINE 100 MCG TABLE: 100 | 90 days supply | Qty: 90 | Fill #0

## 2017-04-23 ENCOUNTER — Ambulatory Visit: Payer: 59

## 2017-05-09 ENCOUNTER — Ambulatory Visit
Admission: RE | Admit: 2017-05-09 | Discharge: 2017-05-09 | Disposition: A | Discharge status: Home / Self Care | Payer: 59 | Source: Ambulatory Visit | Attending: Family Medicine | Admitting: Family Medicine

## 2017-05-09 DIAGNOSIS — Z1231 Encounter for screening mammogram for malignant neoplasm of breast: Secondary | ICD-10-CM | POA: Diagnosis not present

## 2017-05-17 DIAGNOSIS — E559 Vitamin D deficiency, unspecified: Secondary | ICD-10-CM | POA: Diagnosis not present

## 2017-05-17 DIAGNOSIS — E039 Hypothyroidism, unspecified: Secondary | ICD-10-CM | POA: Diagnosis not present

## 2017-05-18 DIAGNOSIS — J309 Allergic rhinitis, unspecified: Secondary | ICD-10-CM | POA: Diagnosis not present

## 2017-05-18 DIAGNOSIS — Z6825 Body mass index (BMI) 25.0-25.9, adult: Secondary | ICD-10-CM | POA: Diagnosis not present

## 2017-05-18 DIAGNOSIS — E039 Hypothyroidism, unspecified: Secondary | ICD-10-CM | POA: Diagnosis not present

## 2017-05-18 DIAGNOSIS — E559 Vitamin D deficiency, unspecified: Secondary | ICD-10-CM | POA: Diagnosis not present

## 2017-06-04 MED FILL — LEVOTHYROXINE 100 MCG TABLE: 100 | 90 days supply | Qty: 90 | Fill #0

## 2017-07-16 MED FILL — ATOVAQUONE-PROGUANIL 250-10: 250-100 | 16 days supply | Qty: 16 | Fill #0

## 2017-07-16 MED FILL — CIPROFLOXACIN HCL 500 MG TA: 500 | 3 days supply | Qty: 6 | Fill #0

## 2017-07-16 MED FILL — VIVOTIF EC CAPSULE: 8 days supply | Qty: 4 | Fill #0

## 2017-08-08 DIAGNOSIS — Z23 Encounter for immunization: Secondary | ICD-10-CM | POA: Diagnosis not present

## 2017-09-12 MED FILL — LEVOTHYROXINE 100 MCG TABLE: 100 | 90 days supply | Qty: 90 | Fill #1

## 2017-11-27 DIAGNOSIS — Z0000 Encounter for general adult medical examination without abnormal findings: Secondary | ICD-10-CM | POA: Diagnosis not present

## 2017-11-27 DIAGNOSIS — E559 Vitamin D deficiency, unspecified: Secondary | ICD-10-CM | POA: Diagnosis not present

## 2017-11-27 DIAGNOSIS — R946 Abnormal results of thyroid function studies: Secondary | ICD-10-CM | POA: Diagnosis not present

## 2017-12-04 DIAGNOSIS — E039 Hypothyroidism, unspecified: Secondary | ICD-10-CM | POA: Diagnosis not present

## 2017-12-04 DIAGNOSIS — J309 Allergic rhinitis, unspecified: Secondary | ICD-10-CM | POA: Diagnosis not present

## 2017-12-04 DIAGNOSIS — M722 Plantar fascial fibromatosis: Secondary | ICD-10-CM | POA: Diagnosis not present

## 2017-12-04 DIAGNOSIS — Z6826 Body mass index (BMI) 26.0-26.9, adult: Secondary | ICD-10-CM | POA: Diagnosis not present

## 2017-12-04 DIAGNOSIS — E559 Vitamin D deficiency, unspecified: Secondary | ICD-10-CM | POA: Diagnosis not present

## 2017-12-04 MED FILL — LEVOTHYROXINE 100 MCG TABLE: 100 | 90 days supply | Qty: 90 | Fill #0

## 2017-12-04 MED FILL — DICLOFENAC SODIUM 1% GEL: 1 | 12 days supply | Qty: 100 | Fill #0

## 2018-01-23 DIAGNOSIS — N390 Urinary tract infection, site not specified: Secondary | ICD-10-CM | POA: Diagnosis not present

## 2018-01-23 DIAGNOSIS — Z6826 Body mass index (BMI) 26.0-26.9, adult: Secondary | ICD-10-CM | POA: Diagnosis not present

## 2018-01-23 DIAGNOSIS — R350 Frequency of micturition: Secondary | ICD-10-CM | POA: Diagnosis not present

## 2018-01-23 MED FILL — CIPROFLOXACIN HCL 500 MG TA: 500 | 3 days supply | Qty: 6 | Fill #0

## 2018-03-12 MED FILL — LEVOTHYROXINE 100 MCG TABLE: 100 | 90 days supply | Qty: 90 | Fill #1

## 2018-03-26 DIAGNOSIS — Z0000 Encounter for general adult medical examination without abnormal findings: Secondary | ICD-10-CM | POA: Diagnosis not present

## 2018-04-01 DIAGNOSIS — Z0000 Encounter for general adult medical examination without abnormal findings: Secondary | ICD-10-CM | POA: Diagnosis not present

## 2018-04-01 DIAGNOSIS — Z6826 Body mass index (BMI) 26.0-26.9, adult: Secondary | ICD-10-CM | POA: Diagnosis not present

## 2018-04-02 ENCOUNTER — Other Ambulatory Visit: Payer: Self-pay | Admitting: Family Medicine

## 2018-04-02 DIAGNOSIS — Z1231 Encounter for screening mammogram for malignant neoplasm of breast: Secondary | ICD-10-CM

## 2018-05-15 ENCOUNTER — Ambulatory Visit
Admission: RE | Admit: 2018-05-15 | Discharge: 2018-05-15 | Disposition: A | Discharge status: Home / Self Care | Payer: 59 | Source: Ambulatory Visit | Attending: Family Medicine | Admitting: Family Medicine

## 2018-05-15 DIAGNOSIS — Z1231 Encounter for screening mammogram for malignant neoplasm of breast: Secondary | ICD-10-CM

## 2018-06-03 DIAGNOSIS — Z6826 Body mass index (BMI) 26.0-26.9, adult: Secondary | ICD-10-CM | POA: Diagnosis not present

## 2018-06-03 DIAGNOSIS — R509 Fever, unspecified: Secondary | ICD-10-CM | POA: Diagnosis not present

## 2018-06-03 DIAGNOSIS — J069 Acute upper respiratory infection, unspecified: Secondary | ICD-10-CM | POA: Diagnosis not present

## 2018-06-11 MED FILL — LEVOTHYROXINE 100 MCG TABLE: 100 | 90 days supply | Qty: 90 | Fill #0

## 2018-09-12 MED FILL — LEVOTHYROXINE 100 MCG TAB: 100 | 90 days supply | Qty: 90 | Fill #0

## 2018-10-29 DIAGNOSIS — N390 Urinary tract infection, site not specified: Secondary | ICD-10-CM | POA: Diagnosis not present

## 2018-10-29 MED FILL — CIPROFLOXACIN HCL 500 MG TA: 500 | 3 days supply | Qty: 6 | Fill #0

## 2018-12-23 MED FILL — LEVOTHYROXINE 100 MCG TABLE: 100 | 90 days supply | Qty: 90 | Fill #0

## 2019-01-09 ENCOUNTER — Telehealth: Payer: Self-pay

## 2019-01-09 NOTE — Telephone Encounter (Signed)
Recommend annual exam visit.  For bladder surgery I refer to urology.

## 2019-01-09 NOTE — Telephone Encounter (Signed)
Last CE 2013, last seen in office 2017.  She did state "I used to be a patient of Dr. Zelphia Cairo."  She called in voice mail stating she is interested in having a "bladder tack procedure".  Wanted to be sure she is calling the right specialist.  Then she questioned if perhaps she should have hysterectomy at same time.

## 2019-01-10 NOTE — Telephone Encounter (Signed)
Patient advised.

## 2019-01-10 NOTE — Telephone Encounter (Signed)
Left message to call me.

## 2019-01-14 DIAGNOSIS — N3946 Mixed incontinence: Secondary | ICD-10-CM | POA: Diagnosis not present

## 2019-01-14 DIAGNOSIS — R350 Frequency of micturition: Secondary | ICD-10-CM | POA: Diagnosis not present

## 2019-01-24 DIAGNOSIS — R350 Frequency of micturition: Secondary | ICD-10-CM | POA: Diagnosis not present

## 2019-01-24 DIAGNOSIS — N3946 Mixed incontinence: Secondary | ICD-10-CM | POA: Diagnosis not present

## 2019-02-06 DIAGNOSIS — N393 Stress incontinence (female) (male): Secondary | ICD-10-CM | POA: Diagnosis not present

## 2019-02-26 ENCOUNTER — Encounter: Payer: Self-pay | Admitting: Gynecology

## 2019-04-03 MED FILL — LEVOTHYROXINE 100 MCG TABLE: 100 | 90 days supply | Qty: 90 | Fill #0

## 2019-04-18 DIAGNOSIS — Z1159 Encounter for screening for other viral diseases: Secondary | ICD-10-CM | POA: Diagnosis not present

## 2019-04-21 DIAGNOSIS — Z0000 Encounter for general adult medical examination without abnormal findings: Secondary | ICD-10-CM | POA: Diagnosis not present

## 2019-04-23 DIAGNOSIS — Z23 Encounter for immunization: Secondary | ICD-10-CM | POA: Diagnosis not present

## 2019-04-23 DIAGNOSIS — Z1211 Encounter for screening for malignant neoplasm of colon: Secondary | ICD-10-CM | POA: Diagnosis not present

## 2019-04-23 DIAGNOSIS — Z6826 Body mass index (BMI) 26.0-26.9, adult: Secondary | ICD-10-CM | POA: Diagnosis not present

## 2019-04-23 DIAGNOSIS — Z0000 Encounter for general adult medical examination without abnormal findings: Secondary | ICD-10-CM | POA: Diagnosis not present

## 2019-04-24 ENCOUNTER — Encounter: Payer: Self-pay | Admitting: Gastroenterology

## 2019-04-28 ENCOUNTER — Other Ambulatory Visit: Payer: Self-pay | Admitting: Family Medicine

## 2019-04-28 DIAGNOSIS — Z1231 Encounter for screening mammogram for malignant neoplasm of breast: Secondary | ICD-10-CM

## 2019-05-22 DIAGNOSIS — H52203 Unspecified astigmatism, bilateral: Secondary | ICD-10-CM | POA: Diagnosis not present

## 2019-05-23 ENCOUNTER — Other Ambulatory Visit: Payer: Self-pay

## 2019-05-23 ENCOUNTER — Ambulatory Visit
Admission: RE | Admit: 2019-05-23 | Discharge: 2019-05-23 | Disposition: A | Discharge status: Home / Self Care | Payer: 59 | Source: Ambulatory Visit | Attending: Family Medicine | Admitting: Family Medicine

## 2019-05-23 DIAGNOSIS — Z1231 Encounter for screening mammogram for malignant neoplasm of breast: Secondary | ICD-10-CM | POA: Diagnosis not present

## 2019-06-04 ENCOUNTER — Encounter: Payer: 59 | Admitting: Gastroenterology

## 2019-06-17 ENCOUNTER — Ambulatory Visit: Payer: 59

## 2019-07-17 MED FILL — LEVOTHYROXINE 100 MCG TABLE: 100 | 90 days supply | Qty: 90 | Fill #0

## 2019-09-16 ENCOUNTER — Ambulatory Visit: Payer: 59 | Admitting: Sports Medicine

## 2019-09-23 ENCOUNTER — Other Ambulatory Visit: Payer: Self-pay

## 2019-09-23 ENCOUNTER — Ambulatory Visit (INDEPENDENT_AMBULATORY_CARE_PROVIDER_SITE_OTHER): Payer: 59 | Admitting: Sports Medicine

## 2019-09-23 VITALS — BP 104/70 | Ht 66.0 in | Wt 160.0 lb

## 2019-09-23 DIAGNOSIS — M79672 Pain in left foot: Secondary | ICD-10-CM

## 2019-09-23 NOTE — Assessment & Plan Note (Signed)
Concern for fifth metatarsal fracture given point tenderness and lack of healing since January.  Will obtain an x-ray of the left foot to further investigate.  If this x-ray is negative, will likely order an MRI to assess for the possibility of a stress fracture.  Patient will likely get the x-ray on 4/21, and we will call her with the results and discuss the plan at that time.

## 2019-09-23 NOTE — Progress Notes (Addendum)
    SUBJECTIVE:   CHIEF COMPLAINT / HPI:   Ms. Eakes presents today with left foot pain.  She said that in January, she was crossing the street and stepped in a hole and twisted her foot.  She was able to continue walking even though she felt pain at the time.  Since then, she has continued to have left foot pain that is exacerbated by long periods of standing or after hiking.  She enjoys taekwondo and hiking for her spare time and is able to do these activities, but she does report foot pain afterward.  She denies any bruising or swelling at the time of her injury or anytime afterwards, although she thinks that her left foot looks a little puffy today.  She denies numbness or tingling of the foot and denies any other previous injury.  PERTINENT  PMH / PSH: Thyroid disease  OBJECTIVE:   BP 104/70   Ht 5\' 6"  (1.676 m)   Wt 160 lb (72.6 kg)   BMI 25.82 kg/m   General: well appearing, appears stated age Left foot: Mild swelling noted along the lateral aspect of the foot, no other visual abnormality.  Significant point tenderness to palpation at the base of the fifth metatarsal, nontender elsewhere on the left foot.  Normal ROM and 5/5 strength, although eversion elicits pain along the lateral left foot.  Neurovascularly intact. Right foot: No visual abnormality.  Nontender to palpation.  Normal range of motion and 5/5 strength.  Neurovascularly intact.  ASSESSMENT/PLAN:   Left foot pain Concern for fifth metatarsal fracture given point tenderness and lack of healing since January.  Will obtain an x-ray of the left foot to further investigate.  If this x-ray is negative, will likely order an MRI to assess for the possibility of a stress fracture.  Patient will likely get the x-ray on 4/21, and we will call her with the results and discuss the plan at that time.     5/21, MD [place] [facility]   Patient seen and evaluated with the resident.  I agree with the above  plan of care.  Review of the x-rays of her left foot shows no obvious fracture.  There is an enthesopathy at the base of the fifth metatarsal.  Based on these findings the patient would like to try to find a new pair of tennis shoes and then return to the office for a gait analysis.  We can also do a limited ultrasound of this area at the same time.  If symptoms persist however that an MRI may be necessary to rule out an occult stress injury at the base of the fifth metatarsal.

## 2019-09-24 ENCOUNTER — Ambulatory Visit
Admission: RE | Admit: 2019-09-24 | Discharge: 2019-09-24 | Disposition: A | Discharge status: Home / Self Care | Payer: 59 | Source: Ambulatory Visit | Attending: Sports Medicine | Admitting: Sports Medicine

## 2019-09-24 DIAGNOSIS — M79672 Pain in left foot: Secondary | ICD-10-CM

## 2019-10-06 MED FILL — LEVOTHYROXINE 100 MCG TABLE: 100 | 90 days supply | Qty: 90 | Fill #1

## 2019-10-14 DIAGNOSIS — Z23 Encounter for immunization: Secondary | ICD-10-CM | POA: Diagnosis not present

## 2019-10-23 DIAGNOSIS — E039 Hypothyroidism, unspecified: Secondary | ICD-10-CM | POA: Diagnosis not present

## 2019-10-24 DIAGNOSIS — N393 Stress incontinence (female) (male): Secondary | ICD-10-CM | POA: Diagnosis not present

## 2019-10-24 DIAGNOSIS — N390 Urinary tract infection, site not specified: Secondary | ICD-10-CM | POA: Diagnosis not present

## 2019-10-24 DIAGNOSIS — M722 Plantar fascial fibromatosis: Secondary | ICD-10-CM | POA: Diagnosis not present

## 2019-10-24 DIAGNOSIS — E039 Hypothyroidism, unspecified: Secondary | ICD-10-CM | POA: Diagnosis not present

## 2019-10-24 MED FILL — LEVOTHYROXINE SODIUM 112 MC: 112 | 90 days supply | Qty: 90 | Fill #0

## 2019-11-04 DIAGNOSIS — N3000 Acute cystitis without hematuria: Secondary | ICD-10-CM | POA: Diagnosis not present

## 2019-11-04 MED FILL — CIPROFLOXACIN HCL 500 MG TA: 500 | 3 days supply | Qty: 6 | Fill #0

## 2020-01-16 DIAGNOSIS — E039 Hypothyroidism, unspecified: Secondary | ICD-10-CM | POA: Diagnosis not present

## 2020-01-16 DIAGNOSIS — M2550 Pain in unspecified joint: Secondary | ICD-10-CM | POA: Diagnosis not present

## 2020-01-20 DIAGNOSIS — E039 Hypothyroidism, unspecified: Secondary | ICD-10-CM | POA: Diagnosis not present

## 2020-01-20 DIAGNOSIS — S93402A Sprain of unspecified ligament of left ankle, initial encounter: Secondary | ICD-10-CM | POA: Diagnosis not present

## 2020-01-20 MED FILL — LEVOTHYROXINE SODIUM 112 MC: 112 | 90 days supply | Qty: 45 | Fill #0

## 2020-01-20 MED FILL — predniSONE 20 MG TABS: 20 | 5 days supply | Qty: 5 | Fill #0

## 2020-03-11 DIAGNOSIS — E039 Hypothyroidism, unspecified: Secondary | ICD-10-CM | POA: Diagnosis not present

## 2020-03-16 DIAGNOSIS — M79672 Pain in left foot: Secondary | ICD-10-CM | POA: Diagnosis not present

## 2020-03-16 DIAGNOSIS — E039 Hypothyroidism, unspecified: Secondary | ICD-10-CM | POA: Diagnosis not present

## 2020-03-19 ENCOUNTER — Other Ambulatory Visit (HOSPITAL_COMMUNITY): Payer: Self-pay | Admitting: Family Medicine

## 2020-03-19 DIAGNOSIS — N3000 Acute cystitis without hematuria: Secondary | ICD-10-CM | POA: Diagnosis not present

## 2020-03-19 MED FILL — PHENAZOPYRIDINE 200 MG TAB: 200 | 2 days supply | Qty: 6 | Fill #0

## 2020-03-19 MED FILL — CIPROFLOXACIN HCL 500 MG TA: 500 | 3 days supply | Qty: 6 | Fill #0

## 2020-04-26 DIAGNOSIS — Z1159 Encounter for screening for other viral diseases: Secondary | ICD-10-CM | POA: Diagnosis not present

## 2020-04-26 DIAGNOSIS — Z0000 Encounter for general adult medical examination without abnormal findings: Secondary | ICD-10-CM | POA: Diagnosis not present

## 2020-04-27 ENCOUNTER — Other Ambulatory Visit (HOSPITAL_COMMUNITY): Payer: Self-pay | Admitting: Family Medicine

## 2020-04-27 DIAGNOSIS — Z0000 Encounter for general adult medical examination without abnormal findings: Secondary | ICD-10-CM | POA: Diagnosis not present

## 2020-04-27 DIAGNOSIS — E039 Hypothyroidism, unspecified: Secondary | ICD-10-CM | POA: Diagnosis not present

## 2020-04-27 DIAGNOSIS — Z309 Encounter for contraceptive management, unspecified: Secondary | ICD-10-CM | POA: Diagnosis not present

## 2020-04-27 DIAGNOSIS — Z1211 Encounter for screening for malignant neoplasm of colon: Secondary | ICD-10-CM | POA: Diagnosis not present

## 2020-04-27 DIAGNOSIS — N3000 Acute cystitis without hematuria: Secondary | ICD-10-CM | POA: Diagnosis not present

## 2020-04-27 DIAGNOSIS — Z01419 Encounter for gynecological examination (general) (routine) without abnormal findings: Secondary | ICD-10-CM | POA: Diagnosis not present

## 2020-04-27 DIAGNOSIS — Z118 Encounter for screening for other infectious and parasitic diseases: Secondary | ICD-10-CM | POA: Diagnosis not present

## 2020-04-27 MED FILL — LEVOTHYROXINE SODIUM 125 MC: 125 | 90 days supply | Qty: 90 | Fill #0

## 2020-05-07 ENCOUNTER — Other Ambulatory Visit: Payer: Self-pay | Admitting: Family Medicine

## 2020-05-07 DIAGNOSIS — Z1231 Encounter for screening mammogram for malignant neoplasm of breast: Secondary | ICD-10-CM

## 2020-06-01 ENCOUNTER — Ambulatory Visit
Admission: RE | Admit: 2020-06-01 | Discharge: 2020-06-01 | Disposition: A | Discharge status: Home / Self Care | Payer: 59 | Source: Ambulatory Visit | Attending: Family Medicine | Admitting: Family Medicine

## 2020-06-01 ENCOUNTER — Other Ambulatory Visit: Payer: Self-pay

## 2020-06-01 DIAGNOSIS — Z1231 Encounter for screening mammogram for malignant neoplasm of breast: Secondary | ICD-10-CM

## 2020-06-17 ENCOUNTER — Ambulatory Visit: Payer: 59

## 2020-06-22 ENCOUNTER — Other Ambulatory Visit (HOSPITAL_COMMUNITY): Payer: Self-pay | Admitting: Family Medicine

## 2020-06-22 DIAGNOSIS — N3000 Acute cystitis without hematuria: Secondary | ICD-10-CM | POA: Diagnosis not present

## 2020-06-22 MED FILL — CIPROFLOXACIN HCL 500 MG TA: 500 | 3 days supply | Qty: 6 | Fill #0

## 2020-06-30 DIAGNOSIS — E039 Hypothyroidism, unspecified: Secondary | ICD-10-CM | POA: Diagnosis not present

## 2020-06-30 DIAGNOSIS — E559 Vitamin D deficiency, unspecified: Secondary | ICD-10-CM | POA: Diagnosis not present

## 2020-06-30 DIAGNOSIS — N926 Irregular menstruation, unspecified: Secondary | ICD-10-CM | POA: Diagnosis not present

## 2020-07-01 DIAGNOSIS — Z309 Encounter for contraceptive management, unspecified: Secondary | ICD-10-CM | POA: Diagnosis not present

## 2020-07-01 DIAGNOSIS — N951 Menopausal and female climacteric states: Secondary | ICD-10-CM | POA: Diagnosis not present

## 2020-07-01 DIAGNOSIS — E039 Hypothyroidism, unspecified: Secondary | ICD-10-CM | POA: Diagnosis not present

## 2020-07-19 MED FILL — LEVOTHYROXINE SODIUM 125 MC: 125 | 90 days supply | Qty: 90 | Fill #1

## 2020-10-22 ENCOUNTER — Other Ambulatory Visit (HOSPITAL_COMMUNITY): Payer: Self-pay

## 2020-10-22 MED FILL — Levothyroxine Sodium Tab 125 MCG: ORAL | 90 days supply | Qty: 90 | Fill #0 | Status: AC

## 2020-10-27 DIAGNOSIS — E039 Hypothyroidism, unspecified: Secondary | ICD-10-CM | POA: Diagnosis not present

## 2020-10-28 DIAGNOSIS — E039 Hypothyroidism, unspecified: Secondary | ICD-10-CM | POA: Diagnosis not present

## 2020-10-28 DIAGNOSIS — Z7185 Encounter for immunization safety counseling: Secondary | ICD-10-CM | POA: Diagnosis not present

## 2020-10-28 DIAGNOSIS — Z30432 Encounter for removal of intrauterine contraceptive device: Secondary | ICD-10-CM | POA: Diagnosis not present

## 2020-10-28 DIAGNOSIS — Z23 Encounter for immunization: Secondary | ICD-10-CM | POA: Diagnosis not present

## 2021-01-21 ENCOUNTER — Other Ambulatory Visit (HOSPITAL_COMMUNITY): Payer: Self-pay

## 2021-01-24 ENCOUNTER — Other Ambulatory Visit (HOSPITAL_COMMUNITY): Payer: Self-pay

## 2021-01-24 MED ORDER — LEVOTHYROXINE SODIUM 125 MCG PO TABS
125.0000 ug | ORAL_TABLET | Freq: Every morning | ORAL | 2 refills | Status: DC
  Filled 2021-01-24: qty 90, 90d supply, fill #0
  Filled 2021-04-21: qty 90, 90d supply, fill #1
  Filled 2021-07-22: qty 90, 90d supply, fill #2

## 2021-02-23 ENCOUNTER — Encounter: Payer: Self-pay | Admitting: Gastroenterology

## 2021-03-11 ENCOUNTER — Other Ambulatory Visit (HOSPITAL_COMMUNITY): Payer: Self-pay

## 2021-03-11 ENCOUNTER — Ambulatory Visit (AMBULATORY_SURGERY_CENTER): Payer: 59

## 2021-03-11 ENCOUNTER — Other Ambulatory Visit: Payer: Self-pay

## 2021-03-11 VITALS — Ht 66.5 in | Wt 160.0 lb

## 2021-03-11 DIAGNOSIS — Z1211 Encounter for screening for malignant neoplasm of colon: Secondary | ICD-10-CM

## 2021-03-11 MED ORDER — NA SULFATE-K SULFATE-MG SULF 17.5-3.13-1.6 GM/177ML PO SOLN
1.0000 | ORAL | 0 refills | Status: DC
  Filled 2021-03-11 – 2021-03-22 (×2): qty 354, 1d supply, fill #0

## 2021-03-11 NOTE — Progress Notes (Signed)
Denies allergies to eggs or soy products. Denies complication of anesthesia or sedation. Denies use of weight loss medication. Denies use of O2.   Emmi instructions given for colonoscopy.  

## 2021-03-22 ENCOUNTER — Other Ambulatory Visit (HOSPITAL_COMMUNITY): Payer: Self-pay

## 2021-03-23 ENCOUNTER — Other Ambulatory Visit (HOSPITAL_COMMUNITY): Payer: Self-pay

## 2021-03-25 ENCOUNTER — Other Ambulatory Visit: Payer: Self-pay

## 2021-03-25 ENCOUNTER — Ambulatory Visit (AMBULATORY_SURGERY_CENTER): Payer: 59 | Admitting: Gastroenterology

## 2021-03-25 ENCOUNTER — Encounter: Payer: 59 | Admitting: Gastroenterology

## 2021-03-25 ENCOUNTER — Encounter: Payer: Self-pay | Admitting: Gastroenterology

## 2021-03-25 VITALS — BP 109/63 | HR 51 | Temp 97.1°F | Resp 12 | Ht 66.0 in | Wt 160.0 lb

## 2021-03-25 DIAGNOSIS — Z1211 Encounter for screening for malignant neoplasm of colon: Secondary | ICD-10-CM

## 2021-03-25 DIAGNOSIS — K648 Other hemorrhoids: Secondary | ICD-10-CM | POA: Diagnosis not present

## 2021-03-25 MED ORDER — SODIUM CHLORIDE 0.9 % IV SOLN: 500.0000 mL | Freq: Once | INTRAVENOUS | Status: DC

## 2021-03-25 NOTE — Patient Instructions (Signed)
Handouts on hemorrhoids and high fiber diet given to patient.  Recommended to use FiberCon 1-2 tablets daily. (OTC medication) Repeat colonoscopy for surveillance in 10 years!! Resume previous diet and continue present medications.  YOU HAD AN ENDOSCOPIC PROCEDURE TODAY AT THE [facility]:   Refer to the procedure report that was given to you for any specific questions about what was found during the examination.  If the procedure report does not answer your questions, please call your gastroenterologist to clarify.  If you requested that your care partner not be given the details of your procedure findings, then the procedure report has been included in a sealed envelope for you to review at your convenience later.  YOU SHOULD EXPECT: Some feelings of bloating in the abdomen. Passage of more gas than usual.  Walking can help get rid of the air that was put into your GI tract during the procedure and reduce the bloating. If you had a lower endoscopy (such as a colonoscopy or flexible sigmoidoscopy) you may notice spotting of blood in your stool or on the toilet paper. If you underwent a bowel prep for your procedure, you may not have a normal bowel movement for a few days.  Please Note:  You might notice some irritation and congestion in your nose or some drainage.  This is from the oxygen used during your procedure.  There is no need for concern and it should clear up in a day or so.  SYMPTOMS TO REPORT IMMEDIATELY:  Following lower endoscopy (colonoscopy or flexible sigmoidoscopy):  Excessive amounts of blood in the stool  Significant tenderness or worsening of abdominal pains  Swelling of the abdomen that is new, acute  Fever of 100F or higher  For urgent or emergent issues, a gastroenterologist can be reached at any hour by calling [phone]. Do not use MyChart messaging for urgent concerns.    DIET:  We do recommend a small meal at first, but then you may proceed to  your regular diet.  Drink plenty of fluids but you should avoid alcoholic beverages for 24 hours.  ACTIVITY:  You should plan to take it easy for the rest of today and you should NOT DRIVE or use heavy machinery until tomorrow (because of the sedation medicines used during the test).    FOLLOW UP: Our staff will call the number listed on your records 48-72 hours following your procedure to check on you and address any questions or concerns that you may have regarding the information given to you following your procedure. If we do not reach you, we will leave a message.  We will attempt to reach you two times.  During this call, we will ask if you have developed any symptoms of COVID 19. If you develop any symptoms (ie: fever, flu-like symptoms, shortness of breath, cough etc.) before then, please call [phone].  If you test positive for Covid 19 in the 2 weeks post procedure, please call and report this information to [place].    If any biopsies were taken you will be contacted by phone or by letter within the next 1-3 weeks.  Please call us at [phone] if you have not heard about the biopsies in 3 weeks.    SIGNATURES/CONFIDENTIALITY: You and/or your care partner have signed paperwork which will be entered into your electronic medical record.  These signatures attest to the fact that that the information above on your After Visit Summary has been reviewed and is understood.  Full responsibility of the confidentiality of this discharge information lies with you and/or your care-partner.  

## 2021-03-25 NOTE — Progress Notes (Signed)
Pt Drowsy. VSS. To PACU, report to RN. No anesthetic complications noted.  

## 2021-03-25 NOTE — Progress Notes (Signed)
GASTROENTEROLOGY PROCEDURE H&P NOTE   Primary Care Physician: Fanny Bien, MD  HPI: Victoria Ellison is a 50 y.o. female who presents for Colonoscopy for Colon cancer screening.  Past Medical History:  Diagnosis Date   Allergy    Thyroid disease    Past Surgical History:  Procedure Laterality Date   APPENDECTOMY     INTRAUTERINE DEVICE INSERTION  08/09/2015   Mirena   vein laser surg     Current Outpatient Medications  Medication Sig Dispense Refill   Cholecalciferol (VITAMIN D PO) Take by mouth.     ciprofloxacin (CIPRO) 500 MG tablet TAKE 1 TABLET BY MOUTH 2 TIMES A DAY FOR BLADDER INFECTION (Patient not taking: Reported on 03/11/2021) 6 tablet 0   Cranberry-Vit C-Lactobacillus (RA CRANBERRY SUPPLEMENTS PO) Take by mouth. Two tablets daily.     levothyroxine (SYNTHROID) 125 MCG tablet Take 1 tablet (125 mcg total) by mouth every morning on an empty stomach 90 tablet 2   Na Sulfate-K Sulfate-Mg Sulf 17.5-3.13-1.6 GM/177ML SOLN Take 1 kit by mouth as directed. 354 mL 0   No current facility-administered medications for this visit.    Current Outpatient Medications:    Cholecalciferol (VITAMIN D PO), Take by mouth., Disp: , Rfl:    ciprofloxacin (CIPRO) 500 MG tablet, TAKE 1 TABLET BY MOUTH 2 TIMES A DAY FOR BLADDER INFECTION (Patient not taking: Reported on 03/11/2021), Disp: 6 tablet, Rfl: 0   Cranberry-Vit C-Lactobacillus (RA CRANBERRY SUPPLEMENTS PO), Take by mouth. Two tablets daily., Disp: , Rfl:    levothyroxine (SYNTHROID) 125 MCG tablet, Take 1 tablet (125 mcg total) by mouth every morning on an empty stomach, Disp: 90 tablet, Rfl: 2   Na Sulfate-K Sulfate-Mg Sulf 17.5-3.13-1.6 GM/177ML SOLN, Take 1 kit by mouth as directed., Disp: 354 mL, Rfl: 0 Allergies  Allergen Reactions   Amoxicillin Rash   Sulfa Antibiotics Rash   Family History  Problem Relation Age of Onset   Dementia Mother    Cancer Mother        Lung   Cancer Father        Lymphoma    Heart disease Paternal Grandmother    Cancer Paternal Grandfather        Prostate   Colon cancer Neg Hx    Esophageal cancer Neg Hx    Rectal cancer Neg Hx    Stomach cancer Neg Hx    Social History   Socioeconomic History   Marital status: Married    Spouse name: Not on file   Number of children: Not on file   Years of education: Not on file   Highest education level: Not on file  Occupational History   Not on file  Tobacco Use   Smoking status: Never   Smokeless tobacco: Never  Substance and Sexual Activity   Alcohol use: Yes    Alcohol/week: 0.0 standard drinks    Comment: occassional, one glass a week   Drug use: No   Sexual activity: Yes    Birth control/protection: I.U.D.    Comment: Mirena inserted 08/09/2015-1st intercourse 50 yo-Fewer than 5 partners  Other Topics Concern   Not on file  Social History Narrative   Not on file   Social Determinants of Health   Financial Resource Strain: Not on file  Food Insecurity: Not on file  Transportation Needs: Not on file  Physical Activity: Not on file  Stress: Not on file  Social Connections: Not on file  Intimate Partner Violence: Not on  file    Physical Exam: There were no vitals filed for this visit. There is no height or weight on file to calculate BMI. GEN: NAD EYE: Sclerae anicteric ENT: MMM CV: Non-tachycardic GI: Soft, NT/ND NEURO:  Alert & Oriented x 3  Lab Results: No results for input(s): WBC, HGB, HCT, PLT in the last 72 hours. BMET No results for input(s): NA, K, CL, CO2, GLUCOSE, BUN, CREATININE, CALCIUM in the last 72 hours. LFT No results for input(s): PROT, ALBUMIN, AST, ALT, ALKPHOS, BILITOT, BILIDIR, IBILI in the last 72 hours. PT/INR No results for input(s): LABPROT, INR in the last 72 hours.   Impression / Plan: This is a 50 y.o.female who presents for Colonoscopy for Colon cancer screening.  The risks and benefits of endoscopic evaluation/treatment were discussed with the patient  and/or family; these include but are not limited to the risk of perforation, infection, bleeding, missed lesions, lack of diagnosis, severe illness requiring hospitalization, as well as anesthesia and sedation related illnesses.  The patient's history has been reviewed, patient examined, no change in status, and deemed stable for procedure.  The patient and/or family is agreeable to proceed.    Justice Britain, MD [facility] Office # [phone]

## 2021-03-25 NOTE — Progress Notes (Signed)
Pt's states no medical or surgical changes since previsit or office visit. 

## 2021-03-25 NOTE — Op Note (Signed)
[place] Patient Name: Victoria Ellison Procedure Date: 03/25/2021 1:37 PM MRN: [id number] Endoscopist: Justice Britain , MD Age: 50 Referring MD:  Date of Birth: 1970-12-02 Gender: Female Account #: [id number] Procedure:                Colonoscopy Indications:              Screening for colorectal malignant neoplasm, This                            is the patient's first colonoscopy Medicines:                Monitored Anesthesia Care Procedure:                Pre-Anesthesia Assessment:                           - Prior to the procedure, a History and Physical                            was performed, and patient medications and                            allergies were reviewed. The patient's tolerance of                            previous anesthesia was also reviewed. The risks                            and benefits of the procedure and the sedation                            options and risks were discussed with the patient.                            All questions were answered, and informed consent                            was obtained. Prior Anticoagulants: The patient has                            taken no previous anticoagulant or antiplatelet                            agents. ASA Grade Assessment: II - A patient with                            mild systemic disease. After reviewing the risks                            and benefits, the patient was deemed in                            satisfactory condition to undergo the procedure.  After obtaining informed consent, the colonoscope                            was passed under direct vision. Throughout the                            procedure, the patient's blood pressure, pulse, and                            oxygen saturations were monitored continuously. The                            Olympus PCF-[id number] (#[id number]) Colonoscope was                            introduced through the  anus and advanced to the 5                            cm into the ileum. The colonoscopy was somewhat                            difficult due to a tortuous colon. Successful                            completion of the procedure was aided by changing                            the patient's position, withdrawing and reinserting                            the scope, straightening and shortening the scope                            to obtain bowel loop reduction and using scope                            torsion. The patient tolerated the procedure. The                            quality of the bowel preparation was adequate. The                            terminal ileum, ileocecal valve, appendiceal                            orifice, and rectum were photographed. Scope In: 1:47:09 PM Scope Out: 2:05:17 PM Scope Withdrawal Time: 0 hours 12 minutes 47 seconds  Total Procedure Duration: 0 hours 18 minutes 8 seconds  Findings:                 The digital rectal exam findings include                            hemorrhoids. Pertinent negatives include no  palpable rectal lesions.                           A moderate amount of semi-liquid stool was found in                            the entire colon, making visualization difficult.                            Lavage of the area was performed using copious                            amounts, resulting in clearance with adequate                            visualization.                           The terminal ileum and ileocecal valve appeared                            normal.                           Normal mucosa was found in the entire colon.                           Non-bleeding non-thrombosed external and internal                            hemorrhoids were found during retroflexion, during                            perianal exam and during digital exam. The                            hemorrhoids were Grade II  (internal hemorrhoids                            that prolapse but reduce spontaneously). Complications:            No immediate complications. Estimated Blood Loss:     Estimated blood loss: none. Impression:               - Hemorrhoids found on digital rectal exam.                           - Stool in the entire examined colon.                           - The examined portion of the ileum was normal.                           - Normal mucosa in the entire examined colon.                           -  Non-bleeding non-thrombosed external and internal                            hemorrhoids. Recommendation:           - The patient will be observed post-procedure,                            until all discharge criteria are met.                           - Discharge patient to home.                           - Patient has a contact number available for                            emergencies. The signs and symptoms of potential                            delayed complications were discussed with the                            patient. Return to normal activities tomorrow.                            Written discharge instructions were provided to the                            patient.                           - High fiber diet.                           - Use FiberCon 1-2 tablets PO daily.                           - Continue present medications.                           - Repeat colonoscopy in 10 years for screening                            purposes.                           - The findings and recommendations were discussed                            with the patient.                           - The findings and recommendations were discussed                            with the patient's family. Justice Britain, MD 03/25/2021 2:10:25 PM

## 2021-03-28 ENCOUNTER — Other Ambulatory Visit: Payer: Self-pay | Admitting: Family Medicine

## 2021-03-28 DIAGNOSIS — Z1231 Encounter for screening mammogram for malignant neoplasm of breast: Secondary | ICD-10-CM

## 2021-03-29 ENCOUNTER — Telehealth: Payer: Self-pay | Admitting: *Deleted

## 2021-03-29 NOTE — Telephone Encounter (Signed)
Message left

## 2021-04-21 ENCOUNTER — Other Ambulatory Visit (HOSPITAL_COMMUNITY): Payer: Self-pay

## 2021-05-02 DIAGNOSIS — Z0000 Encounter for general adult medical examination without abnormal findings: Secondary | ICD-10-CM | POA: Diagnosis not present

## 2021-05-05 DIAGNOSIS — Z23 Encounter for immunization: Secondary | ICD-10-CM | POA: Diagnosis not present

## 2021-05-05 DIAGNOSIS — Z0000 Encounter for general adult medical examination without abnormal findings: Secondary | ICD-10-CM | POA: Diagnosis not present

## 2021-05-10 DIAGNOSIS — H52203 Unspecified astigmatism, bilateral: Secondary | ICD-10-CM | POA: Diagnosis not present

## 2021-05-10 DIAGNOSIS — H5203 Hypermetropia, bilateral: Secondary | ICD-10-CM | POA: Diagnosis not present

## 2021-06-07 ENCOUNTER — Ambulatory Visit
Admission: RE | Admit: 2021-06-07 | Discharge: 2021-06-07 | Disposition: A | Discharge status: Home / Self Care | Payer: 59 | Source: Ambulatory Visit | Attending: Family Medicine | Admitting: Family Medicine

## 2021-06-07 DIAGNOSIS — Z1231 Encounter for screening mammogram for malignant neoplasm of breast: Secondary | ICD-10-CM | POA: Diagnosis not present

## 2021-07-22 ENCOUNTER — Other Ambulatory Visit (HOSPITAL_COMMUNITY): Payer: Self-pay

## 2021-09-09 ENCOUNTER — Other Ambulatory Visit (HOSPITAL_COMMUNITY): Payer: Self-pay

## 2021-09-09 DIAGNOSIS — N3000 Acute cystitis without hematuria: Secondary | ICD-10-CM | POA: Diagnosis not present

## 2021-09-09 MED ORDER — CIPROFLOXACIN HCL 500 MG PO TABS
500.0000 mg | ORAL_TABLET | Freq: Two times a day (BID) | ORAL | 0 refills | Status: AC
  Filled 2021-09-09: qty 6, 3d supply, fill #0

## 2021-09-12 ENCOUNTER — Other Ambulatory Visit (HOSPITAL_COMMUNITY): Payer: Self-pay

## 2021-09-12 DIAGNOSIS — N3000 Acute cystitis without hematuria: Secondary | ICD-10-CM | POA: Diagnosis not present

## 2021-09-12 MED ORDER — CEPHALEXIN 500 MG PO CAPS
500.0000 mg | ORAL_CAPSULE | Freq: Two times a day (BID) | ORAL | 0 refills | Status: AC
  Filled 2021-09-12: qty 14, 7d supply, fill #0

## 2021-10-03 DIAGNOSIS — E039 Hypothyroidism, unspecified: Secondary | ICD-10-CM | POA: Diagnosis not present

## 2021-10-03 DIAGNOSIS — E559 Vitamin D deficiency, unspecified: Secondary | ICD-10-CM | POA: Diagnosis not present

## 2021-10-05 ENCOUNTER — Other Ambulatory Visit (HOSPITAL_COMMUNITY): Payer: Self-pay

## 2021-10-05 DIAGNOSIS — N952 Postmenopausal atrophic vaginitis: Secondary | ICD-10-CM | POA: Diagnosis not present

## 2021-10-05 DIAGNOSIS — E039 Hypothyroidism, unspecified: Secondary | ICD-10-CM | POA: Diagnosis not present

## 2021-10-05 DIAGNOSIS — E559 Vitamin D deficiency, unspecified: Secondary | ICD-10-CM | POA: Diagnosis not present

## 2021-10-05 DIAGNOSIS — N393 Stress incontinence (female) (male): Secondary | ICD-10-CM | POA: Diagnosis not present

## 2021-10-05 MED ORDER — LEVOTHYROXINE SODIUM 125 MCG PO TABS
125.0000 ug | ORAL_TABLET | Freq: Every day | ORAL | 2 refills | Status: AC
  Filled 2021-10-05: qty 60, 84d supply, fill #0
  Filled 2022-01-17: qty 60, 84d supply, fill #1

## 2021-10-05 MED ORDER — DICLOFENAC SODIUM 1 % EX GEL
2.0000 g | Freq: Four times a day (QID) | CUTANEOUS | 11 refills | Status: AC
  Filled 2021-10-05: qty 500, 62d supply, fill #0

## 2021-10-05 MED ORDER — LEVOTHYROXINE SODIUM 112 MCG PO TABS
112.0000 ug | ORAL_TABLET | ORAL | 1 refills | Status: AC
  Filled 2021-10-05: qty 24, 84d supply, fill #0
  Filled 2022-01-17: qty 24, 84d supply, fill #1

## 2021-10-05 MED ORDER — INTRAROSA 6.5 MG VA INST
6.5000 mg | VAGINAL_INSERT | Freq: Every day | VAGINAL | 11 refills | Status: AC
  Filled 2021-10-05: qty 28, 28d supply, fill #0

## 2021-10-06 ENCOUNTER — Other Ambulatory Visit (HOSPITAL_COMMUNITY): Payer: Self-pay

## 2021-10-11 ENCOUNTER — Other Ambulatory Visit (HOSPITAL_COMMUNITY): Payer: Self-pay

## 2022-01-17 ENCOUNTER — Other Ambulatory Visit (HOSPITAL_COMMUNITY): Payer: Self-pay

## 2022-01-25 DIAGNOSIS — E039 Hypothyroidism, unspecified: Secondary | ICD-10-CM | POA: Diagnosis not present

## 2022-02-01 ENCOUNTER — Other Ambulatory Visit (HOSPITAL_COMMUNITY): Payer: Self-pay

## 2022-02-01 DIAGNOSIS — E039 Hypothyroidism, unspecified: Secondary | ICD-10-CM | POA: Diagnosis not present

## 2022-02-01 DIAGNOSIS — N952 Postmenopausal atrophic vaginitis: Secondary | ICD-10-CM | POA: Diagnosis not present

## 2022-02-01 DIAGNOSIS — N390 Urinary tract infection, site not specified: Secondary | ICD-10-CM | POA: Diagnosis not present

## 2022-02-01 MED ORDER — LEVOTHYROXINE SODIUM 112 MCG PO TABS
112.0000 ug | ORAL_TABLET | Freq: Every morning | ORAL | 1 refills | Status: AC
  Filled 2022-02-01 – 2022-02-10 (×2): qty 90, 90d supply, fill #0
  Filled 2022-06-26: qty 90, 90d supply, fill #1

## 2022-02-01 MED ORDER — ESTRADIOL 0.1 MG/GM VA CREA
TOPICAL_CREAM | Freq: Every evening | VAGINAL | 11 refills | Status: AC
  Filled 2022-02-01: qty 42.5, 30d supply, fill #0

## 2022-02-10 ENCOUNTER — Other Ambulatory Visit (HOSPITAL_COMMUNITY): Payer: Self-pay

## 2022-04-24 ENCOUNTER — Other Ambulatory Visit: Payer: Self-pay | Admitting: Family Medicine

## 2022-04-24 DIAGNOSIS — Z1231 Encounter for screening mammogram for malignant neoplasm of breast: Secondary | ICD-10-CM

## 2022-05-08 DIAGNOSIS — H52203 Unspecified astigmatism, bilateral: Secondary | ICD-10-CM | POA: Diagnosis not present

## 2022-05-08 DIAGNOSIS — H5203 Hypermetropia, bilateral: Secondary | ICD-10-CM | POA: Diagnosis not present

## 2022-05-16 DIAGNOSIS — Z114 Encounter for screening for human immunodeficiency virus [HIV]: Secondary | ICD-10-CM | POA: Diagnosis not present

## 2022-05-16 DIAGNOSIS — Z0000 Encounter for general adult medical examination without abnormal findings: Secondary | ICD-10-CM | POA: Diagnosis not present

## 2022-05-16 DIAGNOSIS — Z13220 Encounter for screening for lipoid disorders: Secondary | ICD-10-CM | POA: Diagnosis not present

## 2022-05-17 ENCOUNTER — Other Ambulatory Visit (HOSPITAL_COMMUNITY): Payer: Self-pay

## 2022-05-17 DIAGNOSIS — Z23 Encounter for immunization: Secondary | ICD-10-CM | POA: Diagnosis not present

## 2022-05-17 DIAGNOSIS — H04129 Dry eye syndrome of unspecified lacrimal gland: Secondary | ICD-10-CM | POA: Diagnosis not present

## 2022-05-17 DIAGNOSIS — E039 Hypothyroidism, unspecified: Secondary | ICD-10-CM | POA: Diagnosis not present

## 2022-05-17 DIAGNOSIS — N952 Postmenopausal atrophic vaginitis: Secondary | ICD-10-CM | POA: Diagnosis not present

## 2022-05-17 MED ORDER — LEVOTHYROXINE SODIUM 112 MCG PO TABS
112.0000 ug | ORAL_TABLET | Freq: Every morning | ORAL | 3 refills | Status: AC
  Filled 2022-05-17: qty 90, 90d supply, fill #0
  Filled 2022-09-22: qty 90, 90d supply, fill #1
  Filled 2022-12-28: qty 90, 90d supply, fill #2

## 2022-05-19 DIAGNOSIS — Z0000 Encounter for general adult medical examination without abnormal findings: Secondary | ICD-10-CM | POA: Diagnosis not present

## 2022-06-20 ENCOUNTER — Ambulatory Visit
Admission: RE | Admit: 2022-06-20 | Discharge: 2022-06-20 | Disposition: A | Discharge status: Home / Self Care | Payer: 59 | Source: Ambulatory Visit | Attending: Family Medicine | Admitting: Family Medicine

## 2022-06-20 DIAGNOSIS — Z1231 Encounter for screening mammogram for malignant neoplasm of breast: Secondary | ICD-10-CM | POA: Diagnosis not present

## 2022-06-26 ENCOUNTER — Other Ambulatory Visit (HOSPITAL_COMMUNITY): Payer: Self-pay

## 2022-07-12 IMAGING — MG DIGITAL SCREENING BILAT W/ TOMO W/ CAD
6 of 10 series · 6 of 30 positions shown · non-contrast
Comparison: Previous exam(s).

CLINICAL DATA: Screening.

EXAM:
DIGITAL SCREENING BILATERAL MAMMOGRAM WITH TOMO AND CAD

[R CC synth-2D]
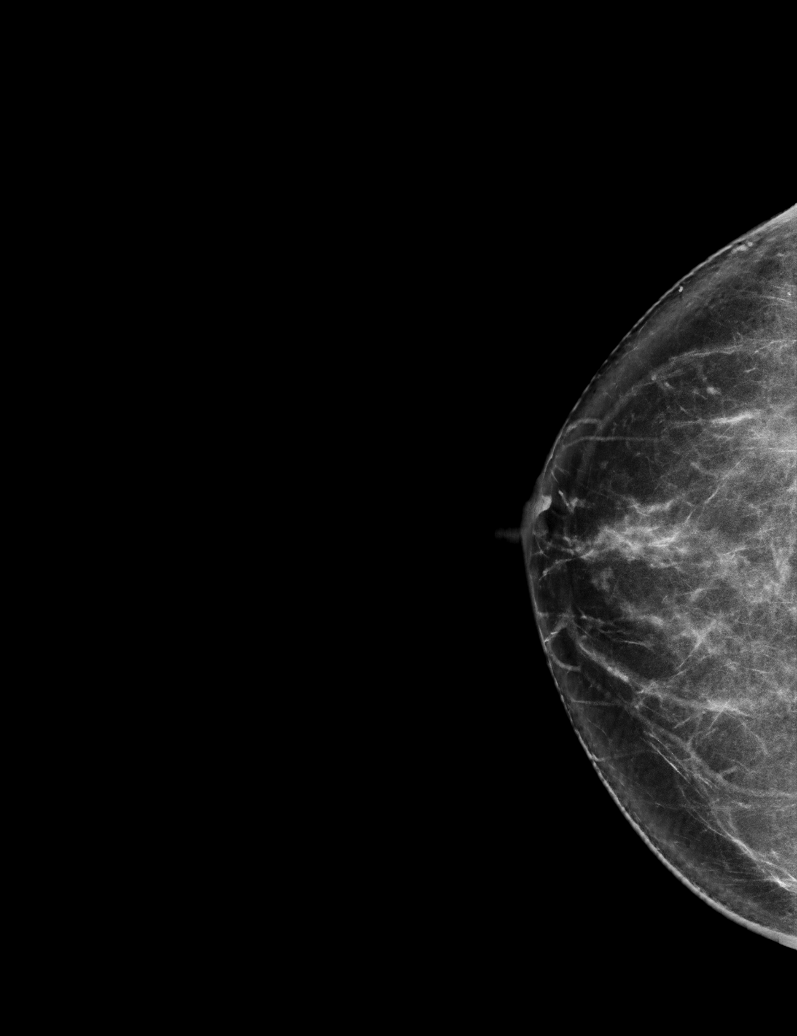

[R MLO synth-2D]
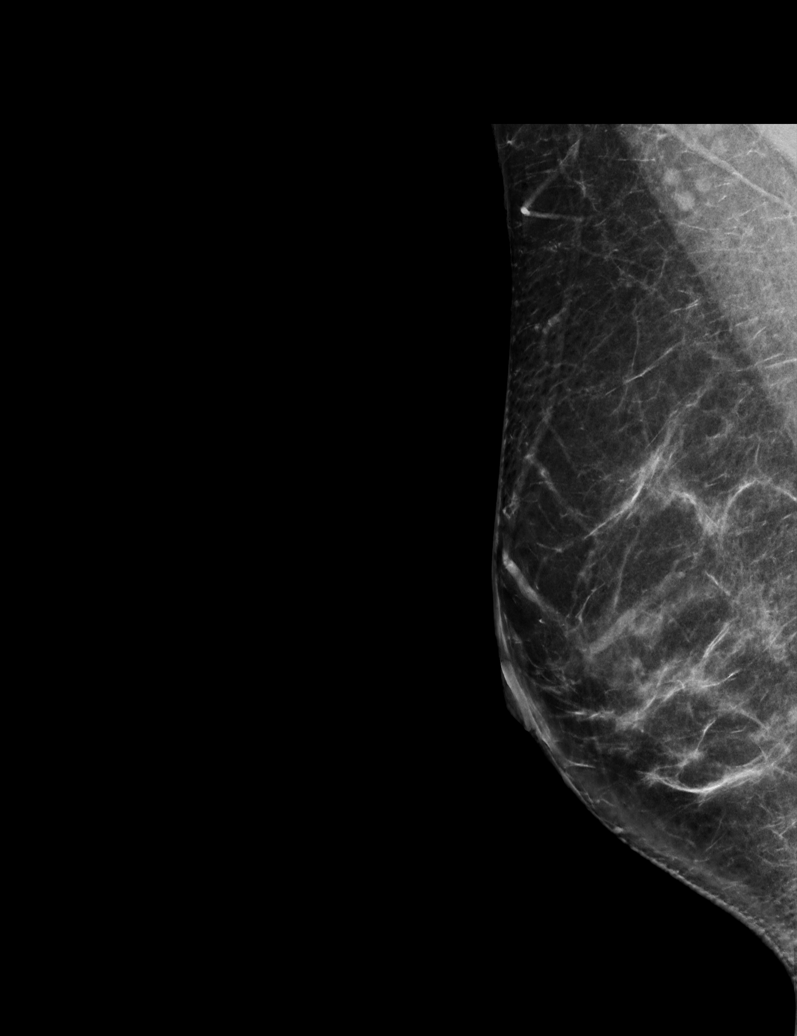

[L CC synth-2D]
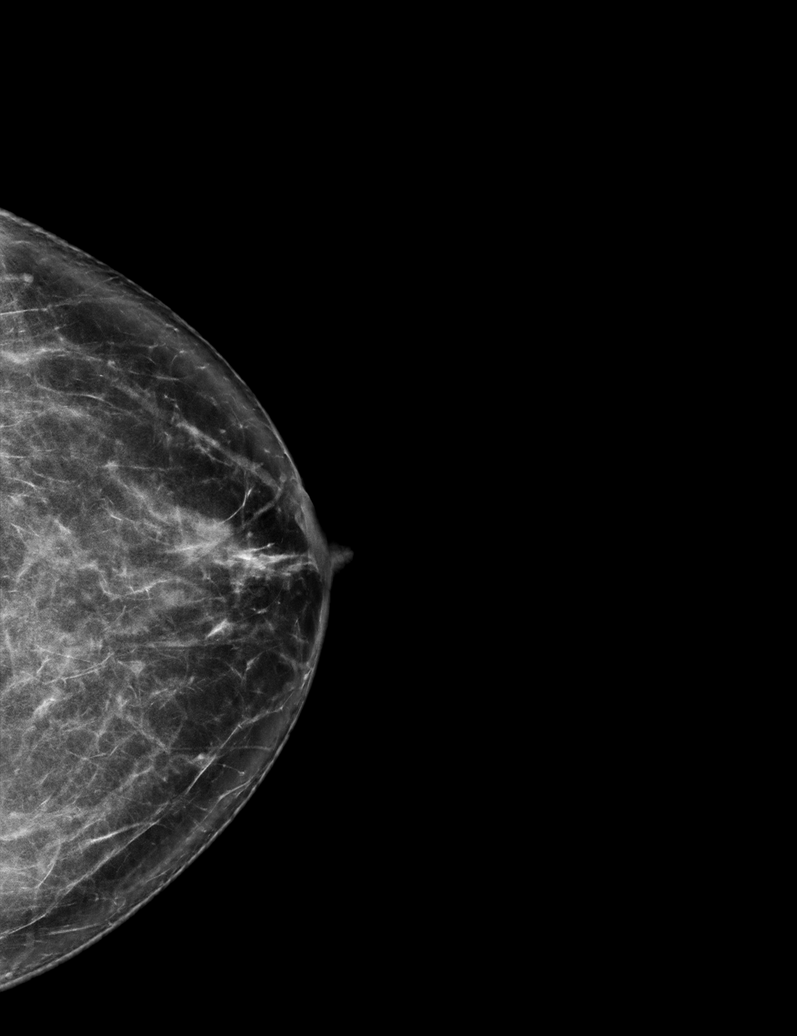

[L MLO synth-2D]
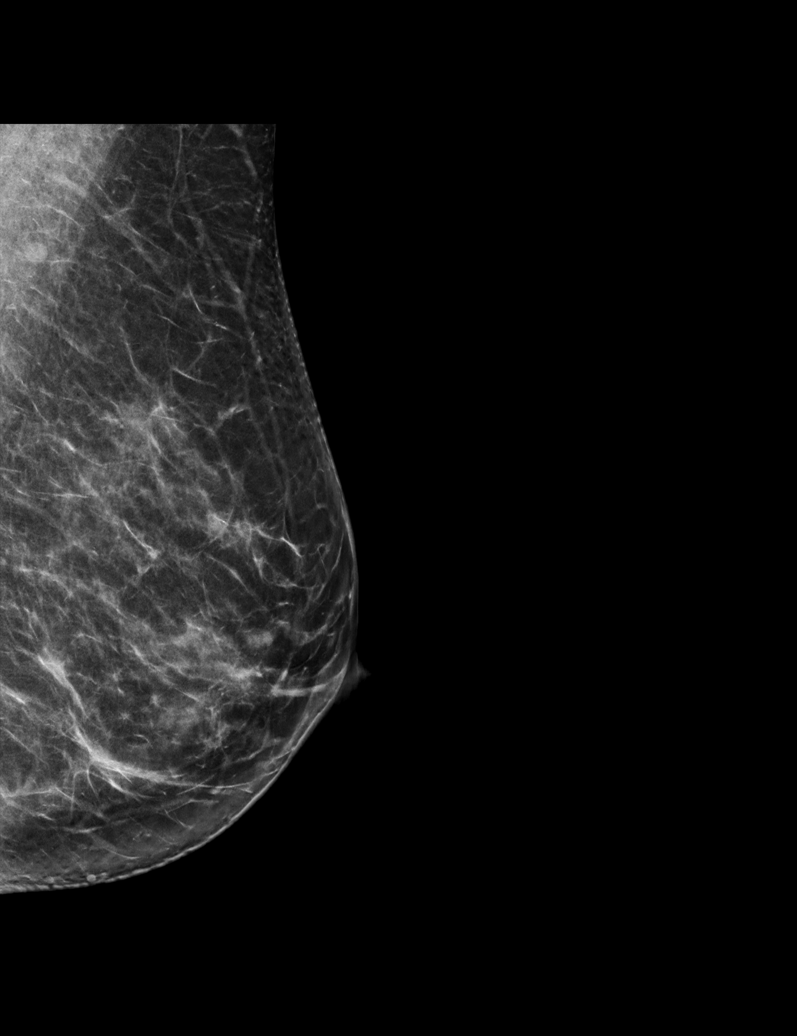

[L XCCL synth-2D]
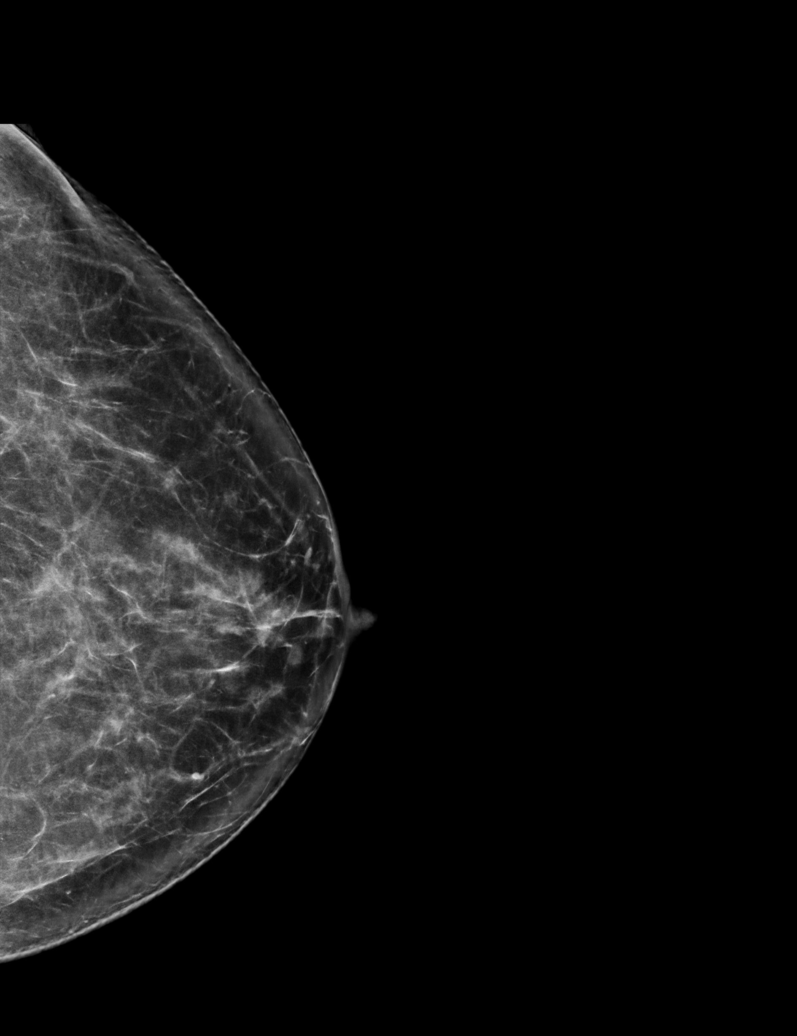

[R MLO tomo · tomo slice 37/72.0]
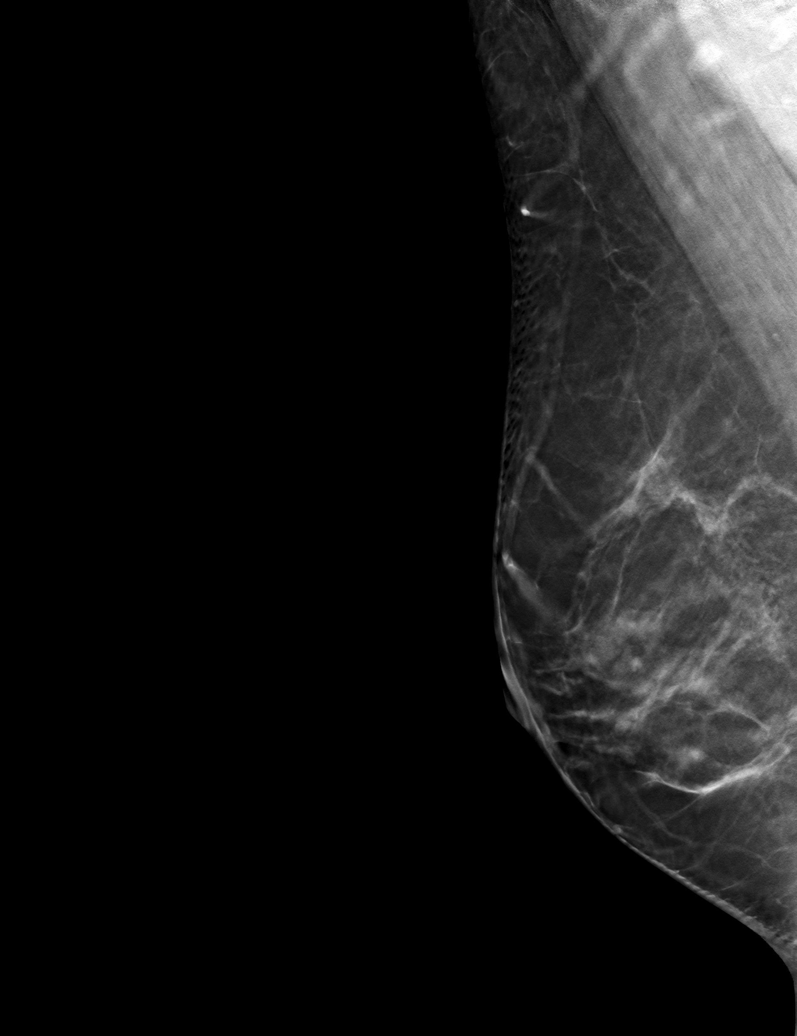

[6 of 30 positions shown; findings below may reference images not displayed]

ACR Breast Density Category b: There are scattered areas of
fibroglandular density.
FINDINGS: There are no findings suspicious for malignancy. Images were
processed with CAD.
IMPRESSION: No mammographic evidence of malignancy. A result letter of this
screening mammogram will be mailed directly to the patient.

RECOMMENDATION:
Screening mammogram in one year. (Code:[id number])

BI-RADS CATEGORY  1: Negative.

## 2022-08-09 ENCOUNTER — Other Ambulatory Visit (HOSPITAL_COMMUNITY): Payer: Self-pay

## 2022-08-09 DIAGNOSIS — N3000 Acute cystitis without hematuria: Secondary | ICD-10-CM | POA: Diagnosis not present

## 2022-08-09 MED ORDER — CIPROFLOXACIN HCL 500 MG PO TABS
500.0000 mg | ORAL_TABLET | Freq: Two times a day (BID) | ORAL | 0 refills | Status: AC
  Filled 2022-08-09: qty 6, 3d supply, fill #0

## 2022-09-22 ENCOUNTER — Other Ambulatory Visit (HOSPITAL_COMMUNITY): Payer: Self-pay

## 2022-10-25 ENCOUNTER — Other Ambulatory Visit (HOSPITAL_COMMUNITY): Payer: Self-pay

## 2022-10-25 DIAGNOSIS — N3000 Acute cystitis without hematuria: Secondary | ICD-10-CM | POA: Diagnosis not present

## 2022-10-25 DIAGNOSIS — N952 Postmenopausal atrophic vaginitis: Secondary | ICD-10-CM | POA: Diagnosis not present

## 2022-10-25 MED ORDER — NITROFURANTOIN MONOHYD MACRO 100 MG PO CAPS
100.0000 mg | ORAL_CAPSULE | Freq: Two times a day (BID) | ORAL | 0 refills | Status: AC
  Filled 2022-10-25: qty 10, 5d supply, fill #0

## 2022-10-25 MED ORDER — ESTRADIOL 0.1 MG/GM VA CREA
TOPICAL_CREAM | Freq: Every evening | VAGINAL | 11 refills | Status: AC
  Filled 2022-10-25: qty 42.5, 30d supply, fill #0

## 2022-11-06 ENCOUNTER — Other Ambulatory Visit (HOSPITAL_COMMUNITY): Payer: Self-pay

## 2022-11-08 ENCOUNTER — Other Ambulatory Visit (HOSPITAL_COMMUNITY): Payer: Self-pay

## 2022-11-08 MED ORDER — OFLOXACIN 0.3 % OP SOLN
OPHTHALMIC | 0 refills | Status: AC
  Filled 2022-11-08: qty 10, 7d supply, fill #0

## 2022-11-14 DIAGNOSIS — E559 Vitamin D deficiency, unspecified: Secondary | ICD-10-CM | POA: Diagnosis not present

## 2022-11-14 DIAGNOSIS — E039 Hypothyroidism, unspecified: Secondary | ICD-10-CM | POA: Diagnosis not present

## 2022-11-21 ENCOUNTER — Other Ambulatory Visit (HOSPITAL_COMMUNITY): Payer: Self-pay

## 2022-11-21 DIAGNOSIS — E039 Hypothyroidism, unspecified: Secondary | ICD-10-CM | POA: Diagnosis not present

## 2022-11-21 DIAGNOSIS — N952 Postmenopausal atrophic vaginitis: Secondary | ICD-10-CM | POA: Diagnosis not present

## 2022-11-21 DIAGNOSIS — E559 Vitamin D deficiency, unspecified: Secondary | ICD-10-CM | POA: Diagnosis not present

## 2022-11-21 MED ORDER — LEVOTHYROXINE SODIUM 112 MCG PO TABS
112.0000 ug | ORAL_TABLET | Freq: Every morning | ORAL | 3 refills | Status: AC
Start: 2022-11-21 — End: ?
  Filled 2022-11-21: qty 36, 84d supply, fill #0
  Filled 2023-06-27: qty 39, 90d supply, fill #0
  Filled 2023-09-27: qty 13, 30d supply, fill #1

## 2022-11-21 MED ORDER — LEVOTHYROXINE SODIUM 100 MCG PO TABS
100.0000 ug | ORAL_TABLET | Freq: Every morning | ORAL | 1 refills | Status: AC
  Filled 2022-11-21: qty 48, 84d supply, fill #0
  Filled 2022-12-12: qty 52, 90d supply, fill #0
  Filled 2023-03-20: qty 52, 90d supply, fill #1
  Filled 2023-06-27: qty 52, 90d supply, fill #2
  Filled 2023-09-27: qty 52, 90d supply, fill #3

## 2022-11-21 MED ORDER — PREMARIN 0.625 MG/GM VA CREA
TOPICAL_CREAM | Freq: Every day | VAGINAL | 11 refills | Status: AC
  Filled 2022-11-21: qty 30, 30d supply, fill #0

## 2022-11-24 ENCOUNTER — Other Ambulatory Visit (HOSPITAL_COMMUNITY): Payer: Self-pay

## 2022-12-04 ENCOUNTER — Other Ambulatory Visit (HOSPITAL_COMMUNITY): Payer: Self-pay

## 2022-12-12 ENCOUNTER — Other Ambulatory Visit (HOSPITAL_COMMUNITY): Payer: Self-pay

## 2023-02-15 DIAGNOSIS — R5383 Other fatigue: Secondary | ICD-10-CM | POA: Diagnosis not present

## 2023-02-15 DIAGNOSIS — E039 Hypothyroidism, unspecified: Secondary | ICD-10-CM | POA: Diagnosis not present

## 2023-02-22 ENCOUNTER — Other Ambulatory Visit (HOSPITAL_COMMUNITY): Payer: Self-pay

## 2023-02-22 DIAGNOSIS — N760 Acute vaginitis: Secondary | ICD-10-CM | POA: Diagnosis not present

## 2023-02-22 DIAGNOSIS — E039 Hypothyroidism, unspecified: Secondary | ICD-10-CM | POA: Diagnosis not present

## 2023-02-22 DIAGNOSIS — R252 Cramp and spasm: Secondary | ICD-10-CM | POA: Diagnosis not present

## 2023-02-22 MED ORDER — LEVOTHYROXINE SODIUM 100 MCG PO TABS
100.0000 ug | ORAL_TABLET | Freq: Every morning | ORAL | 3 refills | Status: AC
  Filled 2023-02-22: qty 48, 84d supply, fill #0
  Filled 2023-09-27: qty 52, 90d supply, fill #0
  Filled 2023-12-28: qty 52, 90d supply, fill #1

## 2023-02-22 MED ORDER — INTRAROSA 6.5 MG VA INST
1.0000 | VAGINAL_INSERT | Freq: Every evening | VAGINAL | 11 refills | Status: AC
Start: 2023-02-22 — End: ?
  Filled 2023-02-22 – 2023-03-23 (×2): qty 28, 28d supply, fill #0

## 2023-02-23 ENCOUNTER — Other Ambulatory Visit (HOSPITAL_COMMUNITY): Payer: Self-pay

## 2023-03-06 ENCOUNTER — Encounter (HOSPITAL_COMMUNITY): Payer: Self-pay

## 2023-03-06 ENCOUNTER — Other Ambulatory Visit (HOSPITAL_COMMUNITY): Payer: Self-pay

## 2023-03-23 ENCOUNTER — Other Ambulatory Visit (HOSPITAL_COMMUNITY): Payer: Self-pay

## 2023-05-09 ENCOUNTER — Other Ambulatory Visit: Payer: Self-pay | Admitting: Family Medicine

## 2023-05-09 DIAGNOSIS — Z1231 Encounter for screening mammogram for malignant neoplasm of breast: Secondary | ICD-10-CM

## 2023-05-10 DIAGNOSIS — H52203 Unspecified astigmatism, bilateral: Secondary | ICD-10-CM | POA: Diagnosis not present

## 2023-05-17 DIAGNOSIS — Z13220 Encounter for screening for lipoid disorders: Secondary | ICD-10-CM | POA: Diagnosis not present

## 2023-05-17 DIAGNOSIS — Z114 Encounter for screening for human immunodeficiency virus [HIV]: Secondary | ICD-10-CM | POA: Diagnosis not present

## 2023-05-17 DIAGNOSIS — Z0000 Encounter for general adult medical examination without abnormal findings: Secondary | ICD-10-CM | POA: Diagnosis not present

## 2023-05-22 ENCOUNTER — Other Ambulatory Visit (HOSPITAL_COMMUNITY): Payer: Self-pay

## 2023-05-22 DIAGNOSIS — Z01419 Encounter for gynecological examination (general) (routine) without abnormal findings: Secondary | ICD-10-CM | POA: Diagnosis not present

## 2023-05-22 DIAGNOSIS — Z0000 Encounter for general adult medical examination without abnormal findings: Secondary | ICD-10-CM | POA: Diagnosis not present

## 2023-05-22 DIAGNOSIS — Z23 Encounter for immunization: Secondary | ICD-10-CM | POA: Diagnosis not present

## 2023-05-22 DIAGNOSIS — Z118 Encounter for screening for other infectious and parasitic diseases: Secondary | ICD-10-CM | POA: Diagnosis not present

## 2023-05-22 DIAGNOSIS — Z01411 Encounter for gynecological examination (general) (routine) with abnormal findings: Secondary | ICD-10-CM | POA: Diagnosis not present

## 2023-05-22 MED ORDER — PREMARIN 0.625 MG/GM VA CREA
1.0000 | TOPICAL_CREAM | Freq: Every evening | VAGINAL | 11 refills | Status: AC
  Filled 2023-05-22: qty 30, 30d supply, fill #0

## 2023-06-01 ENCOUNTER — Other Ambulatory Visit (HOSPITAL_COMMUNITY): Payer: Self-pay

## 2023-06-26 ENCOUNTER — Ambulatory Visit
Admission: RE | Admit: 2023-06-26 | Discharge: 2023-06-26 | Disposition: A | Discharge status: Home / Self Care | Payer: 59 | Source: Ambulatory Visit | Attending: Family Medicine | Admitting: Family Medicine

## 2023-06-26 DIAGNOSIS — Z1231 Encounter for screening mammogram for malignant neoplasm of breast: Secondary | ICD-10-CM

## 2023-06-27 ENCOUNTER — Other Ambulatory Visit (HOSPITAL_COMMUNITY): Payer: Self-pay

## 2023-09-27 ENCOUNTER — Other Ambulatory Visit: Payer: Self-pay

## 2023-09-27 ENCOUNTER — Other Ambulatory Visit (HOSPITAL_COMMUNITY): Payer: Self-pay

## 2023-11-13 DIAGNOSIS — R5383 Other fatigue: Secondary | ICD-10-CM | POA: Diagnosis not present

## 2023-11-13 DIAGNOSIS — E559 Vitamin D deficiency, unspecified: Secondary | ICD-10-CM | POA: Diagnosis not present

## 2023-11-20 ENCOUNTER — Other Ambulatory Visit: Payer: Self-pay

## 2023-11-20 ENCOUNTER — Other Ambulatory Visit (HOSPITAL_COMMUNITY): Payer: Self-pay

## 2023-11-20 DIAGNOSIS — E039 Hypothyroidism, unspecified: Secondary | ICD-10-CM | POA: Diagnosis not present

## 2023-11-20 DIAGNOSIS — Z79899 Other long term (current) drug therapy: Secondary | ICD-10-CM | POA: Diagnosis not present

## 2023-11-20 DIAGNOSIS — N952 Postmenopausal atrophic vaginitis: Secondary | ICD-10-CM | POA: Diagnosis not present

## 2023-11-20 DIAGNOSIS — E559 Vitamin D deficiency, unspecified: Secondary | ICD-10-CM | POA: Diagnosis not present

## 2023-11-20 MED ORDER — LEVOTHYROXINE SODIUM 112 MCG PO TABS
112.0000 ug | ORAL_TABLET | ORAL | 3 refills | Status: AC
  Filled 2023-11-20: qty 39, 90d supply, fill #0
  Filled 2024-02-14: qty 39, 90d supply, fill #1
  Filled 2024-05-15: qty 39, 90d supply, fill #2

## 2023-11-20 MED ORDER — INTRAROSA 6.5 MG VA INST
6.5000 | VAGINAL_INSERT | Freq: Every day | VAGINAL | 11 refills | Status: AC
  Filled 2023-11-20 – 2023-11-23 (×2): qty 28, 28d supply, fill #0

## 2023-11-20 MED ORDER — LEVOTHYROXINE SODIUM 100 MCG PO TABS
100.0000 ug | ORAL_TABLET | ORAL | 3 refills | Status: AC
  Filled 2023-11-20: qty 90, 90d supply, fill #0
  Filled 2024-04-07: qty 52, 90d supply, fill #0
  Filled 2024-07-02: qty 52, 90d supply, fill #1

## 2023-11-23 ENCOUNTER — Other Ambulatory Visit (HOSPITAL_COMMUNITY): Payer: Self-pay

## 2024-04-03 ENCOUNTER — Other Ambulatory Visit (HOSPITAL_COMMUNITY): Payer: Self-pay

## 2024-04-07 ENCOUNTER — Encounter (HOSPITAL_COMMUNITY): Payer: Self-pay

## 2024-04-07 ENCOUNTER — Other Ambulatory Visit (HOSPITAL_COMMUNITY): Payer: Self-pay

## 2024-04-08 ENCOUNTER — Other Ambulatory Visit: Payer: Self-pay

## 2024-05-12 ENCOUNTER — Other Ambulatory Visit: Payer: Self-pay | Admitting: Family Medicine

## 2024-05-12 DIAGNOSIS — Z1231 Encounter for screening mammogram for malignant neoplasm of breast: Secondary | ICD-10-CM

## 2024-05-20 DIAGNOSIS — H5203 Hypermetropia, bilateral: Secondary | ICD-10-CM | POA: Diagnosis not present

## 2024-05-20 DIAGNOSIS — H52203 Unspecified astigmatism, bilateral: Secondary | ICD-10-CM | POA: Diagnosis not present

## 2024-05-26 DIAGNOSIS — N952 Postmenopausal atrophic vaginitis: Secondary | ICD-10-CM | POA: Diagnosis not present

## 2024-05-26 DIAGNOSIS — N951 Menopausal and female climacteric states: Secondary | ICD-10-CM | POA: Diagnosis not present

## 2024-05-26 DIAGNOSIS — Z0000 Encounter for general adult medical examination without abnormal findings: Secondary | ICD-10-CM | POA: Diagnosis not present

## 2024-05-26 DIAGNOSIS — L309 Dermatitis, unspecified: Secondary | ICD-10-CM | POA: Diagnosis not present

## 2024-06-26 ENCOUNTER — Ambulatory Visit
Admission: RE | Admit: 2024-06-26 | Discharge: 2024-06-26 | Disposition: A | Discharge status: Home / Self Care | Source: Ambulatory Visit | Attending: Family Medicine | Admitting: Family Medicine

## 2024-06-26 DIAGNOSIS — Z1231 Encounter for screening mammogram for malignant neoplasm of breast: Secondary | ICD-10-CM

## 2024-07-02 ENCOUNTER — Other Ambulatory Visit: Payer: Self-pay

## 2024-07-02 ENCOUNTER — Other Ambulatory Visit (HOSPITAL_COMMUNITY): Payer: Self-pay

## 2024-07-03 ENCOUNTER — Other Ambulatory Visit (HOSPITAL_COMMUNITY): Payer: Self-pay

## 2024-07-07 ENCOUNTER — Other Ambulatory Visit (HOSPITAL_COMMUNITY): Payer: Self-pay
